# Patient Record
Sex: Female | Born: 1940 | Race: White | Hispanic: No | Smoking: Never smoker
Health system: Southern US, Community
[De-identification: ages and names within clinical notes are randomized; demographics above are authoritative.]

## PROBLEM LIST (undated history)

## (undated) DIAGNOSIS — C50919 Malignant neoplasm of unspecified site of unspecified female breast: Secondary | ICD-10-CM

## (undated) DIAGNOSIS — I10 Essential (primary) hypertension: Secondary | ICD-10-CM

## (undated) DIAGNOSIS — I779 Disorder of arteries and arterioles, unspecified: Secondary | ICD-10-CM

## (undated) DIAGNOSIS — I429 Cardiomyopathy, unspecified: Secondary | ICD-10-CM

## (undated) DIAGNOSIS — I739 Peripheral vascular disease, unspecified: Secondary | ICD-10-CM

## (undated) DIAGNOSIS — Z87891 Personal history of nicotine dependence: Secondary | ICD-10-CM

---

## 2019-09-20 ENCOUNTER — Inpatient Hospital Stay (HOSPITAL_COMMUNITY): Payer: Self-pay

## 2019-09-20 ENCOUNTER — Encounter (HOSPITAL_COMMUNITY): Payer: Self-pay | Admitting: Pulmonary Disease

## 2019-09-20 ENCOUNTER — Inpatient Hospital Stay (HOSPITAL_COMMUNITY)
Admission: AD | Admit: 2019-09-20 | Discharge: 2019-10-07 | DRG: 208 | Disposition: A | Discharge status: Skilled Nursing Facility | Payer: Medicare Other | Source: Other Acute Inpatient Hospital | Attending: Internal Medicine | Admitting: Internal Medicine

## 2019-09-20 ENCOUNTER — Telehealth: Payer: Self-pay | Admitting: Emergency Medicine

## 2019-09-20 ENCOUNTER — Inpatient Hospital Stay (HOSPITAL_COMMUNITY): Payer: Medicare Other

## 2019-09-20 DIAGNOSIS — E785 Hyperlipidemia, unspecified: Secondary | ICD-10-CM | POA: Diagnosis present

## 2019-09-20 DIAGNOSIS — E1122 Type 2 diabetes mellitus with diabetic chronic kidney disease: Secondary | ICD-10-CM | POA: Diagnosis present

## 2019-09-20 DIAGNOSIS — R29810 Facial weakness: Secondary | ICD-10-CM | POA: Diagnosis not present

## 2019-09-20 DIAGNOSIS — G9341 Metabolic encephalopathy: Secondary | ICD-10-CM | POA: Diagnosis not present

## 2019-09-20 DIAGNOSIS — J1289 Other viral pneumonia: Secondary | ICD-10-CM | POA: Diagnosis present

## 2019-09-20 DIAGNOSIS — E875 Hyperkalemia: Secondary | ICD-10-CM | POA: Diagnosis present

## 2019-09-20 DIAGNOSIS — N183 Chronic kidney disease, stage 3 unspecified: Secondary | ICD-10-CM | POA: Diagnosis not present

## 2019-09-20 DIAGNOSIS — I739 Peripheral vascular disease, unspecified: Secondary | ICD-10-CM | POA: Diagnosis present

## 2019-09-20 DIAGNOSIS — Z66 Do not resuscitate: Secondary | ICD-10-CM | POA: Diagnosis present

## 2019-09-20 DIAGNOSIS — Z9221 Personal history of antineoplastic chemotherapy: Secondary | ICD-10-CM

## 2019-09-20 DIAGNOSIS — Z8673 Personal history of transient ischemic attack (TIA), and cerebral infarction without residual deficits: Secondary | ICD-10-CM

## 2019-09-20 DIAGNOSIS — E1151 Type 2 diabetes mellitus with diabetic peripheral angiopathy without gangrene: Secondary | ICD-10-CM | POA: Diagnosis present

## 2019-09-20 DIAGNOSIS — I2510 Atherosclerotic heart disease of native coronary artery without angina pectoris: Secondary | ICD-10-CM | POA: Diagnosis present

## 2019-09-20 DIAGNOSIS — I10 Essential (primary) hypertension: Secondary | ICD-10-CM | POA: Diagnosis present

## 2019-09-20 DIAGNOSIS — Z7901 Long term (current) use of anticoagulants: Secondary | ICD-10-CM | POA: Diagnosis not present

## 2019-09-20 DIAGNOSIS — N1832 Chronic kidney disease, stage 3b: Secondary | ICD-10-CM | POA: Diagnosis present

## 2019-09-20 DIAGNOSIS — Z9010 Acquired absence of unspecified breast and nipple: Secondary | ICD-10-CM | POA: Diagnosis not present

## 2019-09-20 DIAGNOSIS — D649 Anemia, unspecified: Secondary | ICD-10-CM | POA: Diagnosis not present

## 2019-09-20 DIAGNOSIS — I429 Cardiomyopathy, unspecified: Secondary | ICD-10-CM | POA: Diagnosis present

## 2019-09-20 DIAGNOSIS — E876 Hypokalemia: Secondary | ICD-10-CM | POA: Diagnosis not present

## 2019-09-20 DIAGNOSIS — I5022 Chronic systolic (congestive) heart failure: Secondary | ICD-10-CM | POA: Diagnosis present

## 2019-09-20 DIAGNOSIS — R0600 Dyspnea, unspecified: Secondary | ICD-10-CM

## 2019-09-20 DIAGNOSIS — J80 Acute respiratory distress syndrome: Secondary | ICD-10-CM | POA: Diagnosis present

## 2019-09-20 DIAGNOSIS — Z86718 Personal history of other venous thrombosis and embolism: Secondary | ICD-10-CM

## 2019-09-20 DIAGNOSIS — C50919 Malignant neoplasm of unspecified site of unspecified female breast: Secondary | ICD-10-CM | POA: Diagnosis present

## 2019-09-20 DIAGNOSIS — R4701 Aphasia: Secondary | ICD-10-CM | POA: Diagnosis not present

## 2019-09-20 DIAGNOSIS — I6523 Occlusion and stenosis of bilateral carotid arteries: Secondary | ICD-10-CM | POA: Diagnosis present

## 2019-09-20 DIAGNOSIS — I509 Heart failure, unspecified: Secondary | ICD-10-CM

## 2019-09-20 DIAGNOSIS — J9601 Acute respiratory failure with hypoxia: Secondary | ICD-10-CM | POA: Diagnosis not present

## 2019-09-20 DIAGNOSIS — I130 Hypertensive heart and chronic kidney disease with heart failure and stage 1 through stage 4 chronic kidney disease, or unspecified chronic kidney disease: Secondary | ICD-10-CM | POA: Diagnosis present

## 2019-09-20 DIAGNOSIS — R0602 Shortness of breath: Secondary | ICD-10-CM

## 2019-09-20 DIAGNOSIS — I959 Hypotension, unspecified: Secondary | ICD-10-CM | POA: Diagnosis present

## 2019-09-20 DIAGNOSIS — J159 Unspecified bacterial pneumonia: Secondary | ICD-10-CM | POA: Diagnosis present

## 2019-09-20 DIAGNOSIS — Z853 Personal history of malignant neoplasm of breast: Secondary | ICD-10-CM

## 2019-09-20 DIAGNOSIS — J069 Acute upper respiratory infection, unspecified: Secondary | ICD-10-CM | POA: Diagnosis not present

## 2019-09-20 DIAGNOSIS — Z86711 Personal history of pulmonary embolism: Secondary | ICD-10-CM

## 2019-09-20 DIAGNOSIS — U071 COVID-19: Secondary | ICD-10-CM | POA: Diagnosis present

## 2019-09-20 DIAGNOSIS — E1169 Type 2 diabetes mellitus with other specified complication: Secondary | ICD-10-CM | POA: Diagnosis not present

## 2019-09-20 DIAGNOSIS — J1282 Pneumonia due to coronavirus disease 2019: Secondary | ICD-10-CM

## 2019-09-20 DIAGNOSIS — R4781 Slurred speech: Secondary | ICD-10-CM | POA: Diagnosis not present

## 2019-09-20 DIAGNOSIS — J189 Pneumonia, unspecified organism: Secondary | ICD-10-CM | POA: Diagnosis not present

## 2019-09-20 DIAGNOSIS — R0902 Hypoxemia: Secondary | ICD-10-CM | POA: Diagnosis not present

## 2019-09-20 DIAGNOSIS — I159 Secondary hypertension, unspecified: Secondary | ICD-10-CM

## 2019-09-20 DIAGNOSIS — Z87891 Personal history of nicotine dependence: Secondary | ICD-10-CM | POA: Diagnosis not present

## 2019-09-20 DIAGNOSIS — I4891 Unspecified atrial fibrillation: Secondary | ICD-10-CM | POA: Diagnosis not present

## 2019-09-20 DIAGNOSIS — Z7983 Long term (current) use of bisphosphonates: Secondary | ICD-10-CM

## 2019-09-20 DIAGNOSIS — I779 Disorder of arteries and arterioles, unspecified: Secondary | ICD-10-CM | POA: Diagnosis present

## 2019-09-20 HISTORY — DX: Disorder of arteries and arterioles, unspecified: I77.9

## 2019-09-20 HISTORY — DX: Personal history of nicotine dependence: Z87.891

## 2019-09-20 HISTORY — DX: Malignant neoplasm of unspecified site of unspecified female breast: C50.919

## 2019-09-20 HISTORY — DX: Peripheral vascular disease, unspecified: I73.9

## 2019-09-20 HISTORY — DX: Essential (primary) hypertension: I10

## 2019-09-20 HISTORY — DX: Cardiomyopathy, unspecified: I42.9

## 2019-09-20 LAB — CBC WITH DIFFERENTIAL/PLATELET
Abs Immature Granulocytes: 0.05 10*3/uL (ref 0.00–0.07)
Basophils Absolute: 0 10*3/uL (ref 0.0–0.1)
Basophils Relative: 0 %
Eosinophils Absolute: 0 10*3/uL (ref 0.0–0.5)
Eosinophils Relative: 0 %
HCT: 40.4 % (ref 36.0–46.0)
Hemoglobin: 12.9 g/dL (ref 12.0–15.0)
Immature Granulocytes: 1 %
Lymphocytes Relative: 3 %
Lymphs Abs: 0.3 10*3/uL — ABNORMAL LOW (ref 0.7–4.0)
MCH: 30.1 pg (ref 26.0–34.0)
MCHC: 31.9 g/dL (ref 30.0–36.0)
MCV: 94.2 fL (ref 80.0–100.0)
Monocytes Absolute: 0.7 10*3/uL (ref 0.1–1.0)
Monocytes Relative: 8 %
Neutro Abs: 7.5 10*3/uL (ref 1.7–7.7)
Neutrophils Relative %: 88 %
Platelets: 196 10*3/uL (ref 150–400)
RBC: 4.29 MIL/uL (ref 3.87–5.11)
RDW: 13.9 % (ref 11.5–15.5)
WBC: 8.5 10*3/uL (ref 4.0–10.5)
nRBC: 0 % (ref 0.0–0.2)

## 2019-09-20 LAB — C-REACTIVE PROTEIN: CRP: 3.8 mg/dL — ABNORMAL HIGH (ref <1.0)

## 2019-09-20 LAB — COMPREHENSIVE METABOLIC PANEL
ALT: 14 U/L (ref 0–44)
AST: 20 U/L (ref 15–41)
Albumin: 2.7 g/dL — ABNORMAL LOW (ref 3.5–5.0)
Alkaline Phosphatase: 42 U/L (ref 38–126)
Anion gap: 9 (ref 5–15)
BUN: 62 mg/dL — ABNORMAL HIGH (ref 8–23)
CO2: 30 mmol/L (ref 22–32)
Calcium: 8.6 mg/dL — ABNORMAL LOW (ref 8.9–10.3)
Chloride: 98 mmol/L (ref 98–111)
Creatinine, Ser: 1.33 mg/dL — ABNORMAL HIGH (ref 0.44–1.00)
GFR calc Af Amer: 44 mL/min — ABNORMAL LOW (ref >60)
GFR calc non Af Amer: 38 mL/min — ABNORMAL LOW (ref >60)
Glucose, Bld: 87 mg/dL (ref 70–99)
Potassium: 5.4 mmol/L — ABNORMAL HIGH (ref 3.5–5.1)
Sodium: 137 mmol/L (ref 135–145)
Total Bilirubin: 0.8 mg/dL (ref 0.3–1.2)
Total Protein: 5.8 g/dL — ABNORMAL LOW (ref 6.5–8.1)

## 2019-09-20 LAB — POCT I-STAT 7, (LYTES, BLD GAS, ICA,H+H)
Acid-Base Excess: 4 mmol/L — ABNORMAL HIGH (ref 0.0–2.0)
Bicarbonate: 29.4 mmol/L — ABNORMAL HIGH (ref 20.0–28.0)
Calcium, Ion: 1.23 mmol/L (ref 1.15–1.40)
HCT: 39 % (ref 36.0–46.0)
Hemoglobin: 13.3 g/dL (ref 12.0–15.0)
O2 Saturation: 96 %
Patient temperature: 37.8
Potassium: 4.2 mmol/L (ref 3.5–5.1)
Sodium: 133 mmol/L — ABNORMAL LOW (ref 135–145)
TCO2: 31 mmol/L (ref 22–32)
pCO2 arterial: 46.1 mmHg (ref 32.0–48.0)
pH, Arterial: 7.417 (ref 7.350–7.450)
pO2, Arterial: 82 mmHg — ABNORMAL LOW (ref 83.0–108.0)

## 2019-09-20 LAB — URINALYSIS, ROUTINE W REFLEX MICROSCOPIC
Bilirubin Urine: NEGATIVE
Glucose, UA: NEGATIVE mg/dL
Hgb urine dipstick: NEGATIVE
Ketones, ur: NEGATIVE mg/dL
Leukocytes,Ua: NEGATIVE
Nitrite: NEGATIVE
Protein, ur: NEGATIVE mg/dL
Specific Gravity, Urine: 1.017 (ref 1.005–1.030)
pH: 6 (ref 5.0–8.0)

## 2019-09-20 LAB — TYPE AND SCREEN
ABO/RH(D): A POS
Antibody Screen: NEGATIVE

## 2019-09-20 LAB — ABO/RH: ABO/RH(D): A POS

## 2019-09-20 LAB — MAGNESIUM: Magnesium: 2.5 mg/dL — ABNORMAL HIGH (ref 1.7–2.4)

## 2019-09-20 LAB — GLUCOSE, CAPILLARY
Glucose-Capillary: 70 mg/dL (ref 70–99)
Glucose-Capillary: 122 mg/dL — ABNORMAL HIGH (ref 70–99)

## 2019-09-20 LAB — TROPONIN I (HIGH SENSITIVITY)
Troponin I (High Sensitivity): 12 ng/L (ref <18)
Troponin I (High Sensitivity): 16 ng/L (ref <18)

## 2019-09-20 LAB — LACTATE DEHYDROGENASE: LDH: 225 U/L — ABNORMAL HIGH (ref 98–192)

## 2019-09-20 LAB — BRAIN NATRIURETIC PEPTIDE: B Natriuretic Peptide: 29.1 pg/mL (ref 0.0–100.0)

## 2019-09-20 LAB — D-DIMER, QUANTITATIVE: D-Dimer, Quant: 2.42 ug/mL-FEU — ABNORMAL HIGH (ref 0.00–0.50)

## 2019-09-20 LAB — FERRITIN: Ferritin: 524 ng/mL — ABNORMAL HIGH (ref 11–307)

## 2019-09-20 LAB — HEMOGLOBIN A1C
Hgb A1c MFr Bld: 6.1 % — ABNORMAL HIGH (ref 4.8–5.6)
Mean Plasma Glucose: 128.37 mg/dL

## 2019-09-20 LAB — HEPATITIS B SURFACE ANTIGEN: Hepatitis B Surface Ag: NONREACTIVE

## 2019-09-20 LAB — PHOSPHORUS: Phosphorus: 4 mg/dL (ref 2.5–4.6)

## 2019-09-20 MED ORDER — FENTANYL BOLUS VIA INFUSION
25.0000 ug | INTRAVENOUS | Status: DC | PRN
  Administered 2019-09-20: 25 ug via INTRAVENOUS
  Filled 2019-09-20: qty 25

## 2019-09-20 MED ORDER — MIDAZOLAM HCL 2 MG/2ML IJ SOLN
1.0000 mg | INTRAMUSCULAR | Status: DC | PRN
  Administered 2019-09-20 (×2): 1 mg via INTRAVENOUS
  Filled 2019-09-20 (×4): qty 2

## 2019-09-20 MED ORDER — ONDANSETRON HCL 4 MG/2ML IJ SOLN
4.0000 mg | Freq: Four times a day (QID) | INTRAMUSCULAR | Status: DC | PRN
  Administered 2019-09-24 – 2019-10-06 (×3): 4 mg via INTRAVENOUS
  Filled 2019-09-20 (×3): qty 2

## 2019-09-20 MED ORDER — DULOXETINE HCL 60 MG PO CPEP
60.0000 mg | ORAL_CAPSULE | Freq: Every day | ORAL | Status: DC
  Filled 2019-09-20: qty 1

## 2019-09-20 MED ORDER — ACETAMINOPHEN 325 MG PO TABS
650.0000 mg | ORAL_TABLET | ORAL | Status: DC | PRN
  Administered 2019-09-21: 650 mg via ORAL
  Filled 2019-09-20: qty 2

## 2019-09-20 MED ORDER — CHLORHEXIDINE GLUCONATE 0.12% ORAL RINSE (MEDLINE KIT)
15.0000 mL | Freq: Two times a day (BID) | OROMUCOSAL | Status: DC
  Administered 2019-09-20 – 2019-09-22 (×4): 15 mL via OROMUCOSAL

## 2019-09-20 MED ORDER — FAMOTIDINE IN NACL 20-0.9 MG/50ML-% IV SOLN
20.0000 mg | Freq: Two times a day (BID) | INTRAVENOUS | Status: DC
  Administered 2019-09-20 – 2019-09-22 (×5): 20 mg via INTRAVENOUS
  Filled 2019-09-20 (×5): qty 50

## 2019-09-20 MED ORDER — CILOSTAZOL 50 MG PO TABS
50.0000 mg | ORAL_TABLET | Freq: Two times a day (BID) | ORAL | Status: DC
  Administered 2019-09-20 – 2019-09-22 (×5): 50 mg via ORAL
  Filled 2019-09-20 (×7): qty 1

## 2019-09-20 MED ORDER — ZINC SULFATE 220 (50 ZN) MG PO CAPS
220.0000 mg | ORAL_CAPSULE | Freq: Every day | ORAL | Status: DC
  Administered 2019-09-20 – 2019-10-07 (×18): 220 mg
  Filled 2019-09-20 (×19): qty 1

## 2019-09-20 MED ORDER — TRAZODONE HCL 50 MG PO TABS
50.0000 mg | ORAL_TABLET | Freq: Every day | ORAL | Status: DC
  Administered 2019-09-20 – 2019-09-21 (×2): 50 mg via ORAL
  Filled 2019-09-20 (×2): qty 1

## 2019-09-20 MED ORDER — VITAL 1.5 CAL PO LIQD
1000.0000 mL | ORAL | Status: DC
  Administered 2019-09-20: 1000 mL
  Filled 2019-09-20 (×3): qty 1000

## 2019-09-20 MED ORDER — MIDAZOLAM HCL 2 MG/2ML IJ SOLN
1.0000 mg | INTRAMUSCULAR | Status: DC | PRN
  Administered 2019-09-21 (×3): 1 mg via INTRAVENOUS
  Filled 2019-09-20: qty 2

## 2019-09-20 MED ORDER — PRO-STAT SUGAR FREE PO LIQD
30.0000 mL | Freq: Three times a day (TID) | ORAL | Status: DC
  Administered 2019-09-20 – 2019-09-22 (×6): 30 mL
  Filled 2019-09-20 (×6): qty 30

## 2019-09-20 MED ORDER — ORAL CARE MOUTH RINSE
15.0000 mL | OROMUCOSAL | Status: DC
  Administered 2019-09-20 – 2019-09-22 (×21): 15 mL via OROMUCOSAL

## 2019-09-20 MED ORDER — FUROSEMIDE 10 MG/ML IJ SOLN
40.0000 mg | Freq: Once | INTRAMUSCULAR | Status: AC
  Administered 2019-09-20: 40 mg via INTRAVENOUS
  Filled 2019-09-20: qty 4

## 2019-09-20 MED ORDER — DULOXETINE HCL 60 MG PO CPEP
60.0000 mg | ORAL_CAPSULE | Freq: Every day | ORAL | Status: DC
  Administered 2019-09-21: 60 mg via ORAL
  Filled 2019-09-20: qty 1

## 2019-09-20 MED ORDER — CHLORHEXIDINE GLUCONATE CLOTH 2 % EX PADS
6.0000 | MEDICATED_PAD | Freq: Every day | CUTANEOUS | Status: DC
  Administered 2019-09-20 – 2019-10-07 (×18): 6 via TOPICAL

## 2019-09-20 MED ORDER — PHENYLEPHRINE HCL-NACL 10-0.9 MG/250ML-% IV SOLN
0.0000 ug/min | INTRAVENOUS | Status: DC
  Administered 2019-09-20: 20 ug/min via INTRAVENOUS
  Administered 2019-09-21: 30 ug/min via INTRAVENOUS
  Filled 2019-09-20 (×2): qty 250

## 2019-09-20 MED ORDER — ENOXAPARIN SODIUM 80 MG/0.8ML ~~LOC~~ SOLN
75.0000 mg | Freq: Two times a day (BID) | SUBCUTANEOUS | Status: DC
  Administered 2019-09-20 – 2019-09-22 (×4): 75 mg via SUBCUTANEOUS
  Filled 2019-09-20 (×4): qty 0.8

## 2019-09-20 MED ORDER — SIMVASTATIN 20 MG PO TABS
20.0000 mg | ORAL_TABLET | Freq: Every day | ORAL | Status: DC
  Administered 2019-09-20 – 2019-09-21 (×2): 20 mg via ORAL
  Filled 2019-09-20 (×4): qty 1

## 2019-09-20 MED ORDER — PRO-STAT SUGAR FREE PO LIQD: 30.0000 mL | Freq: Two times a day (BID) | ORAL | Status: DC

## 2019-09-20 MED ORDER — FENTANYL CITRATE (PF) 100 MCG/2ML IJ SOLN: 25.0000 ug | Freq: Once | INTRAMUSCULAR | Status: DC

## 2019-09-20 MED ORDER — VITAMIN C 500 MG PO TABS
500.0000 mg | ORAL_TABLET | Freq: Every day | ORAL | Status: DC
  Administered 2019-09-20 – 2019-10-07 (×18): 500 mg
  Filled 2019-09-20 (×17): qty 1

## 2019-09-20 MED ORDER — INSULIN ASPART 100 UNIT/ML ~~LOC~~ SOLN
0.0000 [IU] | SUBCUTANEOUS | Status: DC
  Administered 2019-09-20 – 2019-09-21 (×2): 1 [IU] via SUBCUTANEOUS
  Administered 2019-09-21 – 2019-09-23 (×8): 2 [IU] via SUBCUTANEOUS

## 2019-09-20 MED ORDER — SODIUM ZIRCONIUM CYCLOSILICATE 5 G PO PACK
5.0000 g | PACK | Freq: Once | ORAL | Status: AC
  Administered 2019-09-20: 5 g via ORAL
  Filled 2019-09-20: qty 1

## 2019-09-20 MED ORDER — FENTANYL 2500MCG IN NS 250ML (10MCG/ML) PREMIX INFUSION
25.0000 ug/h | INTRAVENOUS | Status: DC
  Administered 2019-09-20: 25 ug/h via INTRAVENOUS
  Filled 2019-09-20 (×3): qty 250

## 2019-09-20 MED ORDER — DEXMEDETOMIDINE HCL IN NACL 400 MCG/100ML IV SOLN
0.4000 ug/kg/h | INTRAVENOUS | Status: DC
  Administered 2019-09-20: 0.5 ug/kg/h via INTRAVENOUS
  Filled 2019-09-20: qty 100

## 2019-09-20 MED ORDER — VITAL HIGH PROTEIN PO LIQD: 1000.0000 mL | ORAL | Status: DC

## 2019-09-20 NOTE — Progress Notes (Signed)
Initial Nutrition Assessment RD working remotely.  DOCUMENTATION CODES:   Not applicable  INTERVENTION:    Vital 1.5 at 45 ml/h (1080 ml per day)   Pro-stat 30 ml TID   Provides 1920 kcal, 118 gm protein, 825 ml free water daily  NUTRITION DIAGNOSIS:   Increased nutrient needs related to acute illness(COVID-19) as evidenced by estimated needs.  GOAL:   Patient will meet greater than or equal to 90% of their needs  MONITOR:   TF tolerance, Vent status, Labs  REASON FOR ASSESSMENT:   Ventilator, Consult Enteral/tube feeding initiation and management  ASSESSMENT:   78 yo female admitted to [place] [facility] from [facility] on 12/2 with ongoing respiratory distress and for further management by pulmonary critical care. COVID positive on 11/22. Intubated on 11/30. PMH includes breast cancer, former smoker, HTN, CKD-3, DVT.   Received MD Consult for TF initiation and management. OG tube in place.   Patient is currently intubated on ventilator support MV: not yet documented in EMR Temp (24hrs), Avg:99.3 F (37.4 C), Min:98.6 F (37 C), Max:99.9 F (37.7 C)   Labs not yet available for review in EMR.   Medications reviewed and include novolog, vitamin C, zinc sulfate.  No recent weight encounters available for review.  NUTRITION - FOCUSED PHYSICAL EXAM:  deferred  Diet Order:   Diet Order    None      EDUCATION NEEDS:   Not appropriate for education at this time  Skin:  Skin Assessment: Reviewed RN Assessment  Last BM:  no BM documented  Height:   Ht Readings from Last 1 Encounters:  09/20/19 5\' 7"  (1.702 m)    Weight:   Wt Readings from Last 1 Encounters:  09/20/19 74 kg    Ideal Body Weight:  61.4 kg  BMI:  Body mass index is 25.55 kg/m.  Estimated Nutritional Needs:   Kcal:  1850-2100  Protein:  110-130 gm  Fluid:  >/= 1.9 L    Molli Barrows, RD, LDN, [place] Pager [phone] After Hours Pager [phone]

## 2019-09-20 NOTE — Progress Notes (Signed)
ANTICOAGULATION CONSULT NOTE - Initial Consult  Pharmacy Consult for Lovenox Indication: PTA Xarelto for hx DVT  Not on File  Patient Measurements: Weight: 163 lb 2.3 oz (74 kg) Heparin Dosing Weight:   Vital Signs: Temp: 99.5 F (37.5 C) (12/02 1300) Temp Source: Core (12/02 1300) BP: 87/42 (12/02 1300) Pulse Rate: 83 (12/02 1300)  Labs: No results for input(s): HGB, HCT, PLT, APTT, LABPROT, INR, HEPARINUNFRC, HEPRLOWMOCWT, CREATININE, CKTOTAL, CKMB, TROPONINIHS in the last 72 hours.  CrCl cannot be calculated (No successful lab value found.).   Medical History: No past medical history on file.  Medications:  Scheduled:  . chlorhexidine gluconate (MEDLINE KIT)  15 mL Mouth Rinse BID  . cilostazol  50 mg Oral BID  . DULoxetine  60 mg Oral Daily  . feeding supplement (PRO-STAT SUGAR FREE 64)  30 mL Per Tube BID  . feeding supplement (VITAL HIGH PROTEIN)  1,000 mL Per Tube Q24H  . fentaNYL (SUBLIMAZE) injection  25 mcg Intravenous Once  . insulin aspart  0-9 Units Subcutaneous Q4H  . mouth rinse  15 mL Mouth Rinse 10 times per day  . simvastatin  20 mg Oral QHS  . traZODone  50 mg Oral QHS  . vitamin C  500 mg Per Tube Daily  . zinc sulfate  220 mg Per Tube Daily   Infusions:  . famotidine (PEPCID) IV    . fentaNYL infusion INTRAVENOUS      Assessment: 78 yoF [facility].  Hear most recent dose of Xarelto at [facility] was on 12/1 at 17:34.  Pharmacy is consulted to transition to Lovenox. CBC stable, SCr pending, D-dimer 2.42.  Goal of Therapy:  Anti-Xa level 0.6-1 units/ml 4hrs after LMWH dose given Monitor platelets by anticoagulation protocol: Yes   Plan:  Lovenox 1 mg/kg (75 mg) [place] q12h. Pharmacy to f/u renal function, CBC, s/s bleeding.  Gretta Arab PharmD, BCPS Clinical pharmacist phone 7am- 5pm: [phone] 09/20/2019 1:31 PM

## 2019-09-20 NOTE — Progress Notes (Signed)
[facility] PCCM evening rounds  Very awake on fentanyl infusion ABG OK, severe hypoxemia Hyperkalemia, Cr improved compared to yesterday's labs at OSH CXR with interstitial infiltrate, minimal air space disease Echo results pending  Give lasix x1 dose lokelma Repeat labs in AM Add precedex  Roselie Awkward, MD [place] [facility] Pager: [phone] Cell: [phone] If no response, call [phone]

## 2019-09-20 NOTE — Progress Notes (Signed)
Wallet, Keys, and Phone placed in [place] envelope, labeled with patient label, and brought to security by charge RN.

## 2019-09-20 NOTE — Progress Notes (Signed)
[facility] Progress Note Patient Name: Christie Harper DOB: November 08, 1940 MRN: [id number]   Date of Service  09/20/2019  HPI/Events of Note  Hypotension - BP = 62/50 with MAP = 56. Bedside nurse has backed off on sedation IV infusions. Last pH = 7.417 at 4:19 PM.   eICU Interventions  Will order: 1. Phenylephrine IV infusion. Titrate to MAP >= 65.  2. Monitor CVP now and Q 4 hous.      Intervention Category Major Interventions: Hypotension - evaluation and management  Christie Harper 09/20/2019, 8:54 PM

## 2019-09-20 NOTE — Progress Notes (Signed)
Pt arrived from [place] with a 7.5 ETT at 23cm at the lip. PRVC 450/20/80/+12. RT will continue to monitor.

## 2019-09-20 NOTE — H&P (Addendum)
NAME:  Christie Harper, MRN:  [id number], DOB:  [birth year], LOS: 0 ADMISSION DATE:  09/20/2019, CONSULTATION DATE:  12/2 REFERRING MD:  [facility] MD, CHIEF COMPLAINT:  dyspnea   Brief History   78 year old female initially admitted to [facility] on November 21 in the setting of mild hypoxemia and diarrhea.  Covid test became positive on November 22.  Treated with Decadron for 10 days, remdesivir 5 days.  Hypoxemia progressed.  Intubated on November 30.  Transferred to [facility] for further management by pulmonary critical care on December 2.  History of present illness   78 year old female initially admitted to [facility] on November 21 in the setting of mild hypoxemia and diarrhea.  Covid test became positive on November 22.  Treated with Decadron for 10 days, remdesivir 5 days.  Hypoxemia progressed.  Intubated on November 30.  Transferred to [facility] for further management by pulmonary critical care on December 2. The patient was intubated and sedated upon my arrival so she was unable to provide history.  History is obtained by talking to the provider from the other hospital as well as reviewing records including a discharge summary.  As detailed above she was hospitalized for about 10 days before she got intubated.  She was treated for Covid.  She was noted to have a fever on the day that she was intubated (November 30) with worsening hypoxemia.  She also had worsening acute kidney injury.  A PICC line was placed around the time of intubation.  At baseline she is reportedly active, lives independently.  Has no children.  Has a sister.  Per the hospitalist from the outside hospital she desired to be intubated and have full CODE STATUS.  Past Medical History  Hypertension CKD III Breast cancer s/p mastectomy and chemotherapy Former smoker, quit 2008 Bilateral femoral and iliac stents Carotid stenosis L complete, 50% R Unprovoked DVT  on [facility]   11/21 admit [place] hospital diarrhea, mild hypoxemia 11/22 COVID test positive, started remdesivir and decadron 11/24 4-8 L O2 11/30 started heated high flow O2, new fever, intubated 12/2 transfer to [facility]  Consults:    Procedures:  11/30 ETT >  11/30 R arm PICC >   Significant Diagnostic Tests:    Micro Data:  11/19 SARS COV 2 > positive (performed at her PCP office) 12/2 resp culture>   Antimicrobials:  Decadron 11/22> 12/1 Remdesivir 11/22> 11/26 Ceftriaxone 11/24 > 11/28 Azithro 11/24 > 11/28   Interim history/subjective:  As above  Objective   Blood pressure (!) 87/42, pulse 83, temperature 99.5 F (37.5 C), temperature source Core, resp. rate (!) 21, weight 74 kg, SpO2 100 %.       No intake or output data in the 24 hours ending 09/20/19 1324 Filed Weights   09/20/19 1230  Weight: 74 kg    Examination:  General:  In bed on vent HENT: NCAT ETT in place PULM: CTA B, vent supported breathing CV: RRR, no mgr GI: BS+, soft, nontender MSK: normal bulk and tone Neuro: awake, follows commands, moves all four ext  12/2 CXR images personally reviewed: bilateral R>L interstitial infiltrate, ETT in place  Resolved Hospital Problem list     Assessment & Plan:   ARDS COVID pneumonia: treated with steroids and remdesivir at OSH; unclear severity at this point Admit to ICU Check ABG Continue mechanical ventilation per ARDS protocol Target TVol 6-8cc/kgIBW Target Plateau Pressure < 30cm H20 Target driving  pressure less than 15 cm of water Target PaO2 55-65: titrate PEEP/FiO2 per protocol As long as PaO2 to FiO2 ratio is less than 1:150 position in prone position for 16 hours a day Check CVP daily if CVL in place Target CVP less than 4, diurese as necessary Ventilator associated pneumonia prevention protocol  HCAP? Send resp culture Send CBC Chest X-ray now  CKD, stage III Monitor BMET and UOP Replace  electrolytes as needed  Baseline hypertension Hypotension now on propofol Stop propofol Hold amlodipine  Need for sedation for mechanical ventilation Stop propofol Start PAD protocol: fentanyl infusion, prn versed RASS target -1 to -2  History of unprovoked DVT/PE lovenox  History of cardiomyopathy Check BNP Check Echocardiogram  Peripheral arterial disease/carotid artery disease pletal per home regimen  General Check admit labs, COVID biomarkers  Best practice:  Diet: start tube feeding Pain/Anxiety/Delirium protocol (if indicated): as above VAP protocol (if indicated): yes DVT prophylaxis: lovenox GI prophylaxis: famotidine Glucose control: SSI, q4h Mobility: bed rest Code Status: full Family Communication: Will cal the patient's family today Disposition: remain in ICU  Labs   CBC: No results for input(s): WBC, NEUTROABS, HGB, HCT, MCV, PLT in the last 168 hours.  Basic Metabolic Panel: No results for input(s): NA, K, CL, CO2, GLUCOSE, BUN, CREATININE, CALCIUM, MG, PHOS in the last 168 hours. GFR: CrCl cannot be calculated (No successful lab value found.). No results for input(s): PROCALCITON, WBC, LATICACIDVEN in the last 168 hours.  Liver Function Tests: No results for input(s): AST, ALT, ALKPHOS, BILITOT, PROT, ALBUMIN in the last 168 hours. No results for input(s): LIPASE, AMYLASE in the last 168 hours. No results for input(s): AMMONIA in the last 168 hours.  ABG No results found for: PHART, PCO2ART, PO2ART, HCO3, TCO2, ACIDBASEDEF, O2SAT   Coagulation Profile: No results for input(s): INR, PROTIME in the last 168 hours.  Cardiac Enzymes: No results for input(s): CKTOTAL, CKMB, CKMBINDEX, TROPONINI in the last 168 hours.  HbA1C: No results found for: HGBA1C  CBG: No results for input(s): GLUCAP in the last 168 hours.  Review of Systems:   Cannot obtain due to intubation  Past Medical History  She,  has a past medical history of Breast  cancer ([place]), Cardiomyopathy ([place]), Carotid artery disease ([place]), Former smoker, Hypertension, and Peripheral arterial disease ([place]).   Surgical History / social history / family history cannot obtain    Allergies Not on File   Home Medications  Prior to Admission medications   Not on File     Critical care time: 60 minutes      Roselie Awkward, MD [place] [facility] Pager: [phone] Cell: [phone] If no response, call [phone]

## 2019-09-20 NOTE — Progress Notes (Signed)
  Echocardiogram 2D Echocardiogram has been performed.  Christie Harper M 09/20/2019, 2:39 PM

## 2019-09-21 ENCOUNTER — Other Ambulatory Visit: Payer: Self-pay

## 2019-09-21 DIAGNOSIS — I739 Peripheral vascular disease, unspecified: Secondary | ICD-10-CM

## 2019-09-21 DIAGNOSIS — I429 Cardiomyopathy, unspecified: Secondary | ICD-10-CM | POA: Diagnosis not present

## 2019-09-21 DIAGNOSIS — I5022 Chronic systolic (congestive) heart failure: Secondary | ICD-10-CM

## 2019-09-21 DIAGNOSIS — U071 COVID-19: Secondary | ICD-10-CM | POA: Diagnosis not present

## 2019-09-21 DIAGNOSIS — I6523 Occlusion and stenosis of bilateral carotid arteries: Secondary | ICD-10-CM | POA: Diagnosis not present

## 2019-09-21 DIAGNOSIS — J189 Pneumonia, unspecified organism: Secondary | ICD-10-CM

## 2019-09-21 LAB — COMPREHENSIVE METABOLIC PANEL
ALT: 15 U/L (ref 0–44)
AST: 16 U/L (ref 15–41)
Albumin: 2.6 g/dL — ABNORMAL LOW (ref 3.5–5.0)
Alkaline Phosphatase: 48 U/L (ref 38–126)
Anion gap: 10 (ref 5–15)
BUN: 65 mg/dL — ABNORMAL HIGH (ref 8–23)
CO2: 29 mmol/L (ref 22–32)
Calcium: 8.6 mg/dL — ABNORMAL LOW (ref 8.9–10.3)
Chloride: 99 mmol/L (ref 98–111)
Creatinine, Ser: 1.14 mg/dL — ABNORMAL HIGH (ref 0.44–1.00)
GFR calc Af Amer: 53 mL/min — ABNORMAL LOW (ref >60)
GFR calc non Af Amer: 45 mL/min — ABNORMAL LOW (ref >60)
Glucose, Bld: 124 mg/dL — ABNORMAL HIGH (ref 70–99)
Potassium: 3.6 mmol/L (ref 3.5–5.1)
Sodium: 138 mmol/L (ref 135–145)
Total Bilirubin: 0.7 mg/dL (ref 0.3–1.2)
Total Protein: 6 g/dL — ABNORMAL LOW (ref 6.5–8.1)

## 2019-09-21 LAB — GLUCOSE, CAPILLARY
Glucose-Capillary: 109 mg/dL — ABNORMAL HIGH (ref 70–99)
Glucose-Capillary: 125 mg/dL — ABNORMAL HIGH (ref 70–99)
Glucose-Capillary: 128 mg/dL — ABNORMAL HIGH (ref 70–99)
Glucose-Capillary: 151 mg/dL — ABNORMAL HIGH (ref 70–99)
Glucose-Capillary: 157 mg/dL — ABNORMAL HIGH (ref 70–99)
Glucose-Capillary: 185 mg/dL — ABNORMAL HIGH (ref 70–99)
Glucose-Capillary: 91 mg/dL (ref 70–99)

## 2019-09-21 LAB — CBC WITH DIFFERENTIAL/PLATELET
Abs Immature Granulocytes: 0.05 10*3/uL (ref 0.00–0.07)
Basophils Absolute: 0 10*3/uL (ref 0.0–0.1)
Basophils Relative: 0 %
Eosinophils Absolute: 0 10*3/uL (ref 0.0–0.5)
Eosinophils Relative: 0 %
HCT: 41.5 % (ref 36.0–46.0)
Hemoglobin: 13.1 g/dL (ref 12.0–15.0)
Immature Granulocytes: 1 %
Lymphocytes Relative: 3 %
Lymphs Abs: 0.3 10*3/uL — ABNORMAL LOW (ref 0.7–4.0)
MCH: 30.3 pg (ref 26.0–34.0)
MCHC: 31.6 g/dL (ref 30.0–36.0)
MCV: 95.8 fL (ref 80.0–100.0)
Monocytes Absolute: 0.6 10*3/uL (ref 0.1–1.0)
Monocytes Relative: 7 %
Neutro Abs: 7.4 10*3/uL (ref 1.7–7.7)
Neutrophils Relative %: 89 %
Platelets: 197 10*3/uL (ref 150–400)
RBC: 4.33 MIL/uL (ref 3.87–5.11)
RDW: 14.1 % (ref 11.5–15.5)
WBC: 8.3 10*3/uL (ref 4.0–10.5)
nRBC: 0 % (ref 0.0–0.2)

## 2019-09-21 LAB — ECHOCARDIOGRAM COMPLETE: Weight: 2610.25 oz

## 2019-09-21 LAB — PHOSPHORUS
Phosphorus: 4.1 mg/dL (ref 2.5–4.6)
Phosphorus: 3.5 mg/dL (ref 2.5–4.6)

## 2019-09-21 LAB — C-REACTIVE PROTEIN: CRP: 6.4 mg/dL — ABNORMAL HIGH (ref <1.0)

## 2019-09-21 LAB — MAGNESIUM
Magnesium: 2.1 mg/dL (ref 1.7–2.4)
Magnesium: 1.9 mg/dL (ref 1.7–2.4)

## 2019-09-21 LAB — PROCALCITONIN: Procalcitonin: <0.1 ng/mL

## 2019-09-21 LAB — FERRITIN: Ferritin: 535 ng/mL — ABNORMAL HIGH (ref 11–307)

## 2019-09-21 LAB — D-DIMER, QUANTITATIVE: D-Dimer, Quant: 2.43 ug/mL-FEU — ABNORMAL HIGH (ref 0.00–0.50)

## 2019-09-21 MED ORDER — ASTELIN 137 MCG/SPRAY NA SOLN
40.00 | NASAL | Status: DC
Start: 2019-09-20 — End: 2019-09-21

## 2019-09-21 MED ORDER — DIHYDROTACHYSTEROL 0.125 MG PO TABS
10.00 | ORAL_TABLET | ORAL | Status: DC
Start: 2019-09-20 — End: 2019-09-21

## 2019-09-21 MED ORDER — DURACLON EP
1000.00 | EPIDURAL | Status: DC
Start: 2019-09-21 — End: 2019-09-21

## 2019-09-21 MED ORDER — MEDI-NATURAL PLUS 8.6-50 MG PO TABS
50.00 | ORAL_TABLET | ORAL | Status: DC
Start: 2019-09-20 — End: 2019-09-21

## 2019-09-21 MED ORDER — SODIUM CHLORIDE 0.9 % IV SOLN
2.0000 g | Freq: Two times a day (BID) | INTRAVENOUS | Status: AC
  Administered 2019-09-21 – 2019-09-25 (×10): 2 g via INTRAVENOUS
  Filled 2019-09-21 (×10): qty 2

## 2019-09-21 MED ORDER — SODIUM CHLORIDE 3 % IN NEBU
4.0000 mL | INHALATION_SOLUTION | Freq: Two times a day (BID) | RESPIRATORY_TRACT | Status: DC
  Administered 2019-09-21 – 2019-09-22 (×3): 4 mL via RESPIRATORY_TRACT
  Filled 2019-09-21 (×7): qty 4

## 2019-09-21 MED ORDER — FUROSEMIDE 10 MG/ML IJ SOLN
40.0000 mg | Freq: Four times a day (QID) | INTRAMUSCULAR | Status: AC
  Administered 2019-09-21 (×2): 40 mg via INTRAVENOUS
  Filled 2019-09-21 (×2): qty 4

## 2019-09-21 MED ORDER — STRI-DEX MAXIMUM STRENGTH 2 % EX PADS
125.00 | MEDICATED_PAD | CUTANEOUS | Status: DC
Start: ? — End: 2019-09-21

## 2019-09-21 MED ORDER — DIOTAME PO
25.00 | ORAL | Status: DC
Start: 2019-09-20 — End: 2019-09-21

## 2019-09-21 MED ORDER — Medication
500.00 | Status: DC
Start: 2019-09-20 — End: 2019-09-21

## 2019-09-21 MED ORDER — METOPROLOL TARTRATE 5 MG/5ML IV SOLN
2.5000 mg | Freq: Four times a day (QID) | INTRAVENOUS | Status: DC
  Administered 2019-09-21: 2.5 mg via INTRAVENOUS
  Filled 2019-09-21: qty 5

## 2019-09-21 MED ORDER — Medication
20.00 | Status: DC
Start: 2019-09-20 — End: 2019-09-21

## 2019-09-21 MED ORDER — TRIAMINIC COUGH/SORE THROAT PO
15.00 | ORAL | Status: DC
Start: 2019-09-20 — End: 2019-09-21

## 2019-09-21 MED ORDER — MISC NATURAL PRODUCT NASAL NA PACK
60.00 | PACK | NASAL | Status: DC
Start: 2019-09-21 — End: 2019-09-21

## 2019-09-21 MED ORDER — CYANOCOBALAMIN SL
20.00 | SUBLINGUAL | Status: DC
Start: 2019-09-20 — End: 2019-09-21

## 2019-09-21 MED ORDER — FOSPHENYTOIN SODIUM 50 MG PE/ML IJ SOLN
15.00 | INTRAMUSCULAR | Status: DC
Start: ? — End: 2019-09-21

## 2019-09-21 MED ORDER — COMPOUND W FREEZE OFF EX AERO
1.00 | INHALATION_SPRAY | CUTANEOUS | Status: DC
Start: 2019-09-20 — End: 2019-09-21

## 2019-09-21 MED ORDER — Medication
20.00 | Status: DC
Start: ? — End: 2019-09-21

## 2019-09-21 MED ORDER — Medication
Status: DC
Start: 2019-09-20 — End: 2019-09-21

## 2019-09-21 MED ORDER — Medication
Status: DC
Start: ? — End: 2019-09-21

## 2019-09-21 MED ORDER — CALCIUM-VITAMIN D-VITAMIN K 600-1000-90 MG-UNT-MCG PO TABS
0.00 | ORAL_TABLET | ORAL | Status: DC
Start: ? — End: 2019-09-21

## 2019-09-21 MED ORDER — METRISET BURETTE SET MISC
Status: DC
Start: ? — End: 2019-09-21

## 2019-09-21 MED ORDER — METOPROLOL TARTRATE 5 MG/5ML IV SOLN
5.0000 mg | Freq: Four times a day (QID) | INTRAVENOUS | Status: DC
  Administered 2019-09-21 – 2019-09-22 (×3): 5 mg via INTRAVENOUS
  Filled 2019-09-21 (×3): qty 5

## 2019-09-21 MED ORDER — Medication
625.00 | Status: DC
Start: ? — End: 2019-09-21

## 2019-09-21 MED ORDER — EQL SHEER SPOTS SMALL MISC
0.00 | Status: DC
Start: ? — End: 2019-09-21

## 2019-09-21 MED ORDER — EQ LUBRICANT EYE DROPS OP
650.00 | OPHTHALMIC | Status: DC
Start: ? — End: 2019-09-21

## 2019-09-21 MED ORDER — TRIAMINIC COUGH/SORE THROAT PO
15.00 | ORAL | Status: DC
Start: ? — End: 2019-09-21

## 2019-09-21 NOTE — Progress Notes (Signed)
Pharmacy Antibiotic Note  Christie Harper is a 78 y.o. female admitted on 09/20/2019 with Covid-19. Planning to start cefepime for possible bacterial pneumonia on top of Covid. Pt with increased secretions, continues to be febrile, no procalcitonin at this time.   Plan: -Cefepime 2 g IV q12h -Monitor renal fx, cultures, length of therapy  Height: 5\' 7"  (170.2 cm) Weight: 163 lb 2.3 oz (74 kg) IBW/kg (Calculated) : 61.6  Temp (24hrs), Avg:100.5 F (38.1 C), Min:98.2 F (36.8 C), Max:101.3 F (38.5 C)  Recent Labs  Lab 09/20/19 1415 09/21/19 0400  WBC 8.5 8.3  CREATININE 1.33* 1.14*     Christie Harper 09/21/2019 10:23 AM

## 2019-09-21 NOTE — Progress Notes (Signed)
Items sent with security for lock up: 1 wallet 1 photo ID 1 Medicare card 1 [place] card 2 visa cards 1 hundred dollar bill 2 ten dollar bills 6 twenty dollar bills 3 one dollar bills 1 set of car keys 1 cell phone 1 gold bracelet 1 life alert watch bracelet

## 2019-09-21 NOTE — Progress Notes (Signed)
PROGRESS NOTE                                                                                                                                                                                                             Patient Demographics:    Christie Harper, is a 78 y.o. female, DOB - [birth year], HFW:[id number]  Admit date - 09/20/2019   Admitting Physician Juanito Doom, MD  Outpatient Primary MD for the patient is No primary care provider on file.  LOS - 1   No chief complaint on file.      Brief Narrative    78 year old femalewith a history of HTN, severe PAD (Pletal) s/p bilateral iliac and femoral stenting, carotid stenosis (complete L occlusion, 50% R), unprovoked DVT/PE (Xarelto indefinitely), CKD III, h/o BRCA s/pmastectomy and chemo, former tobacco use (quit 2008).  initially admitted to [facility] on November 21 in the setting of mild hypoxemia and diarrhea.  Covid test became positive on November 22.  Treated with Decadron for 10 days, remdesivir 5 days.  Hypoxemia progressed.  Intubated on November 30.  Transferred to [facility] for further management by pulmonary critical care on December   11/21 admit [place] hospital diarrhea, mild hypoxemia 11/22 COVID test positive, started remdesivir and decadron 11/24 4-8 L O2 11/30 started heated high flow O2, new fever, intubated 12/2 transfer to [facility]   Subjective:    Christie Harper today remains sedated, intubated, she was hypotensive, bradycardic overnight on Precedex which has been stopped   Assessment  & Plan :    Active Problems:   COVID-19   Peripheral arterial disease ([place])   Hypertension   Former smoker   Carotid artery disease ([place])   Cardiomyopathy ([place])   Breast cancer ([place])  Acute hypoxic respiratory failure/ARDS due to COVID-19 pneumonia -Patient required intubation at OSH, transferred to [facility] for [facility] management. -Vent  management per PCCM, on ARDS protocol, sedation per PCCM as well. -She was treated with remdesivir. -Continue with steroids. -she is late  her disease to consider Actemra or plasma. -Patient remains febrile, follow-up blood cultures, on cefepime empirically. -Procalcitonin within normal limit. -continue With diuresis.   COVID-19 Labs  Recent Labs    09/20/19 1415 09/21/19 0400  DDIMER 2.42* 2.43*  FERRITIN 524* 535*  LDH 225*  --  CRP 3.8* 6.4*   Chronic systolic CHF -EF 14% with apical akinesis, she is already on full dose Lovenox for known history of PE/DVT. -Per outpatient cardiologist note, most recent cardiac cath 2012, significant for nonobstructive 60% mid LAD. -On IV Lasix, monitor volume status closely.  Hyperlipidemia -Continue with statin  History of PE/DVT -Unprovoked, continue with anticoagulation, on Xarelto as outpatient, currently on Lovenox during hospital stay.  History of CAD -Please see above discussion, 2D echo with apical akinesis,  continue with statin, and beta-blockers.  History of severe PVD/carotid artery disease -Status post multiple stenting, continue with Pletal.  Goals of care:  -Cussed with her sister, patient has no children, she is her next of kin, and discussed goals of care, patient sister would like to continue with full medical management currently, including vent support, and pressors as needed, but agreeable to patient DNR      Code Status : DNR  Family Communication  : Sister via phone  Disposition Plan  : Remains in ICU  Consults  :  PCCM  Procedures  :  None  DVT Prophylaxis  :  Lovenox  Lab Results  Component Value Date   PLT 197 09/21/2019    Antibiotics  :    Anti-infectives (From admission, onward)   Start     Dose/Rate Route Frequency Ordered Stop   09/21/19 1100  ceFEPIme (MAXIPIME) 2 g in sodium chloride 0.9 % 100 mL IVPB     2 g 200 mL/hr over 30 Minutes Intravenous Every 12 hours 09/21/19 1022           Objective:   Vitals:   09/21/19 1100 09/21/19 1140 09/21/19 1200 09/21/19 1300  BP: (!) 147/76 132/76 127/69 122/71  Pulse: (!) 124 (!) 118 (!) 118 (!) 120  Resp: '18 20 20 20  ' Temp: 99.9 F (37.7 C) (!) 100.6 F (38.1 C) (!) 100.6 F (38.1 C) (!) 100.8 F (38.2 C)  TempSrc:   Core   SpO2: 93% 92% 93% 94%  Weight:      Height:        Wt Readings from Last 3 Encounters:  09/20/19 74 kg     Intake/Output Summary (Last 24 hours) at 09/21/2019 1458 Last data filed at 09/21/2019 1300 Gross per 24 hour  Intake 1838.48 ml  Output 2890 ml  Net -1051.52 ml     Physical Exam  Patient is sedated, intubated, currently on sedation vacation, she is able to open her eyes, will does not follow commands, appears to be moving all extremities. Symmetrical Chest wall movement, Good air movement bilaterally,  RRR,No Gallops,Rubs or new Murmurs, No Parasternal Heave +ve B.Sounds, Abd Soft,No rebound - guarding or rigidity. No Cyanosis, Clubbing or edema, No new Rash or bruise     Data Review:    CBC Recent Labs  Lab 09/20/19 1415 09/20/19 1619 09/21/19 0400  WBC 8.5  --  8.3  HGB 12.9 13.3 13.1  HCT 40.4 39.0 41.5  PLT 196  --  197  MCV 94.2  --  95.8  MCH 30.1  --  30.3  MCHC 31.9  --  31.6  RDW 13.9  --  14.1  LYMPHSABS 0.3*  --  0.3*  MONOABS 0.7  --  0.6  EOSABS 0.0  --  0.0  BASOSABS 0.0  --  0.0    Chemistries  Recent Labs  Lab 09/20/19 1415 09/20/19 1619 09/21/19 0400  NA 137 133* 138  K 5.4* 4.2 3.6  CL  98  --  99  CO2 30  --  29  GLUCOSE 87  --  124*  BUN 62*  --  65*  CREATININE 1.33*  --  1.14*  CALCIUM 8.6*  --  8.6*  MG 2.5*  --  2.1  AST 20  --  16  ALT 14  --  15  ALKPHOS 42  --  48  BILITOT 0.8  --  0.7   ------------------------------------------------------------------------------------------------------------------ No results for input(s): CHOL, HDL, LDLCALC, TRIG, CHOLHDL, LDLDIRECT in the last 72 hours.  Lab Results    Component Value Date   HGBA1C 6.1 (H) 09/20/2019   ------------------------------------------------------------------------------------------------------------------ No results for input(s): TSH, T4TOTAL, T3FREE, THYROIDAB in the last 72 hours.  Invalid input(s): FREET3 ------------------------------------------------------------------------------------------------------------------ Recent Labs    09/20/19 1415 09/21/19 0400  FERRITIN 524* 535*    Coagulation profile No results for input(s): INR, PROTIME in the last 168 hours.  Recent Labs    09/20/19 1415 09/21/19 0400  DDIMER 2.42* 2.43*    Cardiac Enzymes No results for input(s): CKMB, TROPONINI, MYOGLOBIN in the last 168 hours.  Invalid input(s): CK ------------------------------------------------------------------------------------------------------------------    Component Value Date/Time   BNP 29.1 09/20/2019 1415    Inpatient Medications  Scheduled Meds:  chlorhexidine gluconate (MEDLINE KIT)  15 mL Mouth Rinse BID   Chlorhexidine Gluconate Cloth  6 each Topical Daily   cilostazol  50 mg Oral BID   enoxaparin (LOVENOX) injection  75 mg Subcutaneous Q12H   feeding supplement (PRO-STAT SUGAR FREE 64)  30 mL Per Tube TID   fentaNYL (SUBLIMAZE) injection  25 mcg Intravenous Once   furosemide  40 mg Intravenous Q6H   insulin aspart  0-9 Units Subcutaneous Q4H   mouth rinse  15 mL Mouth Rinse 10 times per day   metoprolol tartrate  2.5 mg Intravenous Q6H   simvastatin  20 mg Oral QHS   sodium chloride HYPERTONIC  4 mL Nebulization Q12H   traZODone  50 mg Oral QHS   vitamin C  500 mg Per Tube Daily   zinc sulfate  220 mg Per Tube Daily   Continuous Infusions:  ceFEPime (MAXIPIME) IV Stopped (09/21/19 1204)   dexmedetomidine (PRECEDEX) IV infusion Stopped (09/20/19 2332)   famotidine (PEPCID) IV Stopped (09/21/19 1000)   feeding supplement (VITAL 1.5 CAL) 1,000 mL (09/20/19 1739)    fentaNYL infusion INTRAVENOUS 125 mcg/hr (09/21/19 1300)   phenylephrine (NEO-SYNEPHRINE) Adult infusion Stopped (09/21/19 0348)   PRN Meds:.acetaminophen, fentaNYL, midazolam, midazolam, ondansetron (ZOFRAN) IV  Micro Results Recent Results (from the past 240 hour(s))  Culture, blood (routine x 2)     Status: None (Preliminary result)   Collection Time: 09/21/19  8:50 AM   Specimen: BLOOD  Result Value Ref Range Status   Specimen Description   Final    BLOOD RIGHT HAND Performed at [facility], [place] [address] [place], [place] [address]    Special Requests   Final    BOTTLES DRAWN AEROBIC ONLY Blood Culture results may not be optimal due to an inadequate volume of blood received in culture bottles Performed at [place] [address], [place], [place] [address]    Culture PENDING  Incomplete   Report Status PENDING  Incomplete    Radiology Reports Portable Chest 1 View  Result Date: 09/20/2019 CLINICAL DATA:  Shortness of breath EXAM: PORTABLE CHEST 1 VIEW COMPARISON:  None. FINDINGS: Patchy areas of interstitial airspace disease throughout the right lung and to lesser extent  left mid lung. No pleural effusion or pneumothorax. Stable cardiomediastinal silhouette. No aggressive osseous lesion. Endotracheal tube with the tip 6 cm above the carina. Right-sided PICC line with the tip projecting over the SVC. Nasogastric tube coursing below the diaphragm. No acute osseous abnormality. IMPRESSION: 1. Patchy areas of interstitial airspace disease throughout the right lung and to lesser extent left mid lung concerning for atypical viral pneumonia. Electronically Signed   By: Kathreen Devoid   On: 09/20/2019 13:45      Kahle Mcqueen M.D on 09/21/2019 at 2:58 PM  Between 7am to 7pm - Pager - [phone]  After 7pm go to [email or web address] - password [facility]  [facility] -  Office  [phone]

## 2019-09-21 NOTE — Progress Notes (Signed)
NAME:  Christie Harper, MRN:  [id number], DOB:  1941/07/18, LOS: 1 ADMISSION DATE:  09/20/2019, CONSULTATION DATE:  12/2 REFERRING MD:  [facility] MD, CHIEF COMPLAINT:  dyspnea   Brief History   78 year old female initially admitted to [facility] on November 21 in the setting of mild hypoxemia and diarrhea.  Covid test became positive on November 22.  Treated with Decadron for 10 days, remdesivir 5 days.  Hypoxemia progressed.  Intubated on November 30.  Transferred to [facility] for further management by pulmonary critical care on December 2.  Past Medical History  Hypertension CKD III Breast cancer s/p mastectomy and chemotherapy Former smoker, quit 2008 Bilateral femoral and iliac stents Carotid stenosis L complete, 50% R Unprovoked DVT on [facility]   11/21 admit [place] hospital diarrhea, mild hypoxemia 11/22 COVID test positive, started remdesivir and decadron 11/24 4-8 L O2 11/30 started heated high flow O2, new fever, intubated 12/2 transfer to [facility]  Consults:    Procedures:  11/30 ETT >  11/30 R arm PICC >   Significant Diagnostic Tests:  12/3 Echo> LVEF 35-40%, mild LVH, inf/sept akinesis, RV not well visualized  Micro Data:  11/19 SARS COV 2 > positive (performed at her PCP office) 12/2 resp culture>   Antimicrobials:  Decadron 11/22> 12/1 Remdesivir 11/22> 11/26 Ceftriaxone 11/24 > 11/28 Azithro 11/24 > 11/28   Interim history/subjective:   Bradycardia, hypotension on precedex, that was stopped overnight  Objective   Blood pressure (!) 115/50, pulse 60, temperature (!) 101.3 F (38.5 C), resp. rate 20, height 5\' 7"  (1.702 m), weight 74 kg, SpO2 91 %. CVP:  [23 mmHg] 23 mmHg  Vent Mode: PRVC FiO2 (%):  [50 %-80 %] 50 % Set Rate:  [20 bmp] 20 bmp Vt Set:  [450 mL] 450 mL PEEP:  [10 [id number] cmH20] 10 cmH20 Plateau Pressure:  [20 cmH20-27 cmH20] 20 cmH20   Intake/Output Summary (Last 24  hours) at 09/21/2019 0755 Last data filed at 09/21/2019 0600 Gross per 24 hour  Intake 788.86 ml  Output 1485 ml  Net -696.14 ml   Filed Weights   09/20/19 1230  Weight: 74 kg    Examination:  General:  In bed on vent HENT: NCAT ETT in place PULM: CTA B, vent supported breathing CV: RRR, no mgr GI: BS+, soft, nontender MSK: normal bulk and tone Neuro: sedated on vent   12/2 CXR images personally reviewed: bilateral R>L interstitial infiltrate, ETT in place  Resolved Hospital Problem list     Assessment & Plan:   ARDS COVID pneumonia: treated with steroids and remdesivir at OSH; unclear severity at this point Suspect HCAP Continue mechanical ventilation per ARDS protocol Target TVol 6-8cc/kgIBW Target Plateau Pressure < 30cm H20 Target driving pressure less than 15 cm of water Target PaO2 55-65: titrate PEEP/FiO2 per protocol As long as PaO2 to FiO2 ratio is less than 1:150 position in prone position for 16 hours a day Check CVP daily if CVL in place Target CVP less than 4, diurese as necessary Ventilator associated pneumonia prevention protocol 12/3 start cefepime, send resp culture, f/u culture, start hypertonic saline, diurese again  Systolic heart failure, chronic Lasix Metoprolol Tele  CKD, stage III Monitor BMET and UOP Replace electrolytes as needed  Baseline hypertension Restart metoprolol IV low dose and titrate up as able  Need for sedation for mechanical ventilation PAD protocol -1 to -2 Fentanyl infusion, prn versed  History of unprovoked DVT/PE lovenox  Peripheral  arterial disease/carotid artery disease pletal per home regimen   Best practice:  Diet: start tube feeding Pain/Anxiety/Delirium protocol (if indicated): as above VAP protocol (if indicated): yes DVT prophylaxis: lovenox GI prophylaxis: famotidine Glucose control: SSI, q4h Mobility: bed rest Code Status: full Family Communication: per [facility] Disposition: remain in ICU   Labs   CBC: Recent Labs  Lab 09/20/19 1415 09/20/19 1619 09/21/19 0400  WBC 8.5  --  8.3  NEUTROABS 7.5  --  7.4  HGB 12.9 13.3 13.1  HCT 40.4 39.0 41.5  MCV 94.2  --  95.8  PLT 196  --  [id number]    Basic Metabolic Panel: Recent Labs  Lab 09/20/19 1415 09/20/19 1619 09/21/19 0400  NA 137 133* 138  K 5.4* 4.2 3.6  CL 98  --  99  CO2 30  --  29  GLUCOSE 87  --  124*  BUN 62*  --  65*  CREATININE 1.33*  --  1.14*  CALCIUM 8.6*  --  8.6*  MG 2.5*  --  2.1  PHOS 4.0  --  4.1   GFR: Estimated Creatinine Clearance: 41.4 mL/min (A) (by C-G formula based on SCr of 1.14 mg/dL (H)). Recent Labs  Lab 09/20/19 1415 09/21/19 0400  WBC 8.5 8.3    Liver Function Tests: Recent Labs  Lab 09/20/19 1415 09/21/19 0400  AST 20 16  ALT 14 15  ALKPHOS 42 48  BILITOT 0.8 0.7  PROT 5.8* 6.0*  ALBUMIN 2.7* 2.6*   No results for input(s): LIPASE, AMYLASE in the last 168 hours. No results for input(s): AMMONIA in the last 168 hours.  ABG    Component Value Date/Time   PHART 7.417 09/20/2019 1619   PCO2ART 46.1 09/20/2019 1619   PO2ART 82.0 (L) 09/20/2019 1619   HCO3 29.4 (H) 09/20/2019 1619   TCO2 31 09/20/2019 1619   O2SAT 96.0 09/20/2019 1619     Coagulation Profile: No results for input(s): INR, PROTIME in the last 168 hours.  Cardiac Enzymes: No results for input(s): CKTOTAL, CKMB, CKMBINDEX, TROPONINI in the last 168 hours.  HbA1C: Hgb A1c MFr Bld  Date/Time Value Ref Range Status  09/20/2019 02:15 PM 6.1 (H) 4.8 - 5.6 % Final    Comment:    (NOTE) Pre diabetes:          5.7%-6.4% Diabetes:              >6.4% Glycemic control for   <7.0% adults with diabetes     CBG: Recent Labs  Lab 09/20/19 1528 09/20/19 1941 09/21/19 0426 09/21/19 0735  GLUCAP 70 122* 128* 125*     Critical care time: 40 minutes      Roselie Awkward, MD [place] [facility] Pager: [phone] Cell: [phone] If no response, call [phone]

## 2019-09-22 ENCOUNTER — Other Ambulatory Visit: Payer: Self-pay

## 2019-09-22 DIAGNOSIS — I429 Cardiomyopathy, unspecified: Secondary | ICD-10-CM | POA: Diagnosis not present

## 2019-09-22 DIAGNOSIS — J80 Acute respiratory distress syndrome: Secondary | ICD-10-CM | POA: Diagnosis not present

## 2019-09-22 DIAGNOSIS — J069 Acute upper respiratory infection, unspecified: Secondary | ICD-10-CM

## 2019-09-22 DIAGNOSIS — U071 COVID-19: Secondary | ICD-10-CM | POA: Diagnosis not present

## 2019-09-22 DIAGNOSIS — I739 Peripheral vascular disease, unspecified: Secondary | ICD-10-CM | POA: Diagnosis not present

## 2019-09-22 LAB — COMPREHENSIVE METABOLIC PANEL
ALT: 18 U/L (ref 0–44)
AST: 21 U/L (ref 15–41)
Albumin: 2.3 g/dL — ABNORMAL LOW (ref 3.5–5.0)
Alkaline Phosphatase: 50 U/L (ref 38–126)
Anion gap: 11 (ref 5–15)
BUN: 67 mg/dL — ABNORMAL HIGH (ref 8–23)
CO2: 33 mmol/L — ABNORMAL HIGH (ref 22–32)
Calcium: 9 mg/dL (ref 8.9–10.3)
Chloride: 97 mmol/L — ABNORMAL LOW (ref 98–111)
Creatinine, Ser: 1.29 mg/dL — ABNORMAL HIGH (ref 0.44–1.00)
GFR calc Af Amer: 45 mL/min — ABNORMAL LOW (ref >60)
GFR calc non Af Amer: 39 mL/min — ABNORMAL LOW (ref >60)
Glucose, Bld: 180 mg/dL — ABNORMAL HIGH (ref 70–99)
Potassium: 3.7 mmol/L (ref 3.5–5.1)
Sodium: 141 mmol/L (ref 135–145)
Total Bilirubin: 0.8 mg/dL (ref 0.3–1.2)
Total Protein: 5.9 g/dL — ABNORMAL LOW (ref 6.5–8.1)

## 2019-09-22 LAB — PHOSPHORUS: Phosphorus: 3.7 mg/dL (ref 2.5–4.6)

## 2019-09-22 LAB — CBC WITH DIFFERENTIAL/PLATELET
Abs Immature Granulocytes: 0.06 10*3/uL (ref 0.00–0.07)
Basophils Absolute: 0 10*3/uL (ref 0.0–0.1)
Basophils Relative: 0 %
Eosinophils Absolute: 0 10*3/uL (ref 0.0–0.5)
Eosinophils Relative: 0 %
HCT: 40.7 % (ref 36.0–46.0)
Hemoglobin: 12.7 g/dL (ref 12.0–15.0)
Immature Granulocytes: 1 %
Lymphocytes Relative: 3 %
Lymphs Abs: 0.3 10*3/uL — ABNORMAL LOW (ref 0.7–4.0)
MCH: 30 pg (ref 26.0–34.0)
MCHC: 31.2 g/dL (ref 30.0–36.0)
MCV: 96 fL (ref 80.0–100.0)
Monocytes Absolute: 0.6 10*3/uL (ref 0.1–1.0)
Monocytes Relative: 6 %
Neutro Abs: 9.4 10*3/uL — ABNORMAL HIGH (ref 1.7–7.7)
Neutrophils Relative %: 90 %
Platelets: 201 10*3/uL (ref 150–400)
RBC: 4.24 MIL/uL (ref 3.87–5.11)
RDW: 14.2 % (ref 11.5–15.5)
WBC: 10.5 10*3/uL (ref 4.0–10.5)
nRBC: 0 % (ref 0.0–0.2)

## 2019-09-22 LAB — MRSA PCR SCREENING: MRSA by PCR: NEGATIVE

## 2019-09-22 LAB — POCT I-STAT 7, (LYTES, BLD GAS, ICA,H+H)
Acid-Base Excess: 9 mmol/L — ABNORMAL HIGH (ref 0.0–2.0)
Bicarbonate: 33.4 mmol/L — ABNORMAL HIGH (ref 20.0–28.0)
Calcium, Ion: 1.25 mmol/L (ref 1.15–1.40)
HCT: 36 % (ref 36.0–46.0)
Hemoglobin: 12.2 g/dL (ref 12.0–15.0)
O2 Saturation: 92 %
Patient temperature: 37.9
Potassium: 3.7 mmol/L (ref 3.5–5.1)
Sodium: 141 mmol/L (ref 135–145)
TCO2: 35 mmol/L — ABNORMAL HIGH (ref 22–32)
pCO2 arterial: 46.1 mmHg (ref 32.0–48.0)
pH, Arterial: 7.471 — ABNORMAL HIGH (ref 7.350–7.450)
pO2, Arterial: 63 mmHg — ABNORMAL LOW (ref 83.0–108.0)

## 2019-09-22 LAB — GLUCOSE, CAPILLARY
Glucose-Capillary: 135 mg/dL — ABNORMAL HIGH (ref 70–99)
Glucose-Capillary: 156 mg/dL — ABNORMAL HIGH (ref 70–99)
Glucose-Capillary: 167 mg/dL — ABNORMAL HIGH (ref 70–99)
Glucose-Capillary: 180 mg/dL — ABNORMAL HIGH (ref 70–99)
Glucose-Capillary: 186 mg/dL — ABNORMAL HIGH (ref 70–99)

## 2019-09-22 LAB — PROCALCITONIN: Procalcitonin: <0.1 ng/mL

## 2019-09-22 LAB — MAGNESIUM: Magnesium: 2 mg/dL (ref 1.7–2.4)

## 2019-09-22 LAB — FERRITIN: Ferritin: 536 ng/mL — ABNORMAL HIGH (ref 11–307)

## 2019-09-22 LAB — D-DIMER, QUANTITATIVE: D-Dimer, Quant: 2.25 ug/mL-FEU — ABNORMAL HIGH (ref 0.00–0.50)

## 2019-09-22 LAB — C-REACTIVE PROTEIN: CRP: 18.4 mg/dL — ABNORMAL HIGH (ref <1.0)

## 2019-09-22 MED ORDER — SIMVASTATIN 20 MG PO TABS
20.0000 mg | ORAL_TABLET | Freq: Every day | ORAL | Status: DC
  Administered 2019-09-23 – 2019-10-06 (×13): 20 mg via ORAL
  Filled 2019-09-22 (×14): qty 1

## 2019-09-22 MED ORDER — CILOSTAZOL 50 MG PO TABS
50.0000 mg | ORAL_TABLET | Freq: Two times a day (BID) | ORAL | Status: DC
  Administered 2019-09-23 – 2019-10-07 (×28): 50 mg via ORAL
  Filled 2019-09-22 (×30): qty 1

## 2019-09-22 MED ORDER — METOPROLOL TARTRATE 5 MG/5ML IV SOLN
5.0000 mg | Freq: Four times a day (QID) | INTRAVENOUS | Status: DC | PRN
  Administered 2019-09-22 – 2019-10-06 (×9): 5 mg via INTRAVENOUS
  Filled 2019-09-22 (×10): qty 5

## 2019-09-22 MED ORDER — AMIODARONE HCL IN DEXTROSE 360-4.14 MG/200ML-% IV SOLN
60.0000 mg/h | INTRAVENOUS | Status: DC
  Administered 2019-09-22: 60 mg/h via INTRAVENOUS
  Filled 2019-09-22: qty 200

## 2019-09-22 MED ORDER — TRAZODONE HCL 50 MG PO TABS
50.0000 mg | ORAL_TABLET | Freq: Every day | ORAL | Status: DC
  Administered 2019-09-23 – 2019-09-28 (×6): 50 mg via ORAL
  Filled 2019-09-22 (×7): qty 1

## 2019-09-22 MED ORDER — ENOXAPARIN SODIUM 80 MG/0.8ML ~~LOC~~ SOLN
70.0000 mg | Freq: Two times a day (BID) | SUBCUTANEOUS | Status: DC
  Administered 2019-09-22 – 2019-09-28 (×12): 70 mg via SUBCUTANEOUS
  Filled 2019-09-22 (×12): qty 0.8

## 2019-09-22 MED ORDER — AMIODARONE LOAD VIA INFUSION
150.0000 mg | Freq: Once | INTRAVENOUS | Status: AC
  Administered 2019-09-22: 150 mg via INTRAVENOUS
  Filled 2019-09-22: qty 83.34

## 2019-09-22 MED ORDER — POTASSIUM CHLORIDE 20 MEQ/15ML (10%) PO SOLN
40.0000 meq | Freq: Two times a day (BID) | ORAL | Status: AC
  Administered 2019-09-22 – 2019-09-23 (×2): 40 meq
  Filled 2019-09-22 (×2): qty 30

## 2019-09-22 MED ORDER — AMIODARONE HCL IN DEXTROSE 360-4.14 MG/200ML-% IV SOLN: 30.0000 mg/h | INTRAVENOUS | Status: DC

## 2019-09-22 MED ORDER — FUROSEMIDE 10 MG/ML IJ SOLN
40.0000 mg | Freq: Four times a day (QID) | INTRAMUSCULAR | Status: AC
  Administered 2019-09-22 (×2): 40 mg via INTRAVENOUS
  Filled 2019-09-22 (×2): qty 4

## 2019-09-22 MED ORDER — METOPROLOL TARTRATE 25 MG/10 ML ORAL SUSPENSION
25.0000 mg | Freq: Two times a day (BID) | ORAL | Status: DC
  Administered 2019-09-22 – 2019-09-27 (×9): 25 mg
  Filled 2019-09-22 (×11): qty 10

## 2019-09-22 MED ORDER — FAMOTIDINE 40 MG/5ML PO SUSR
20.0000 mg | Freq: Every day | ORAL | Status: DC
  Administered 2019-09-23: 20 mg
  Filled 2019-09-22: qty 2.5

## 2019-09-22 NOTE — Progress Notes (Signed)
NAME:  Christie Harper, MRN:  [id number], DOB:  Dec 02, 1940, LOS: 2 ADMISSION DATE:  09/20/2019, CONSULTATION DATE:  12/2 REFERRING MD:  [facility] MD, CHIEF COMPLAINT:  dyspnea   Brief History   78 year old female initially admitted to [facility] on November 21 in the setting of mild hypoxemia and diarrhea.  Covid test became positive on November 22.  Treated with Decadron for 10 days, remdesivir 5 days.  Hypoxemia progressed.  Intubated on November 30.  Transferred to [facility] for further management by pulmonary critical care on December 2.  Past Medical History  Hypertension CKD III Breast cancer s/p mastectomy and chemotherapy Former smoker, quit 2008 Bilateral femoral and iliac stents Carotid stenosis L complete, 50% R Unprovoked DVT on [facility]   11/21 admit [place] hospital diarrhea, mild hypoxemia 11/22 COVID test positive, started remdesivir and decadron 11/24 4-8 L O2 11/30 started heated high flow O2, new fever, intubated 12/2 transfer to [facility]  Consults:    Procedures:  11/30 ETT >  11/30 R arm PICC >   Significant Diagnostic Tests:  12/3 Echo> LVEF 35-40%, mild LVH, inf/sept akinesis, RV not well visualized 12/3 diuresed, significant improvement in oxygenation  Micro Data:  11/19 SARS COV 2 > positive (performed at her PCP office) 12/2 resp culture>   Antimicrobials:  Decadron 11/22> 12/1 Remdesivir 11/22> 11/26 Ceftriaxone 11/24 > 11/28 Azithro 11/24 > 11/28   Interim history/subjective:   Significant improvement in oxygenation Diuresed yesterday Follows commands Atrial fib yesterday treated with metop Failed SBT due to low respiratory rate  Objective   Blood pressure (!) 146/61, pulse (!) 126, temperature (!) 100.4 F (38 C), resp. rate (!) 21, height 5\' 7"  (1.702 m), weight 71.9 kg, SpO2 90 %.    Vent Mode: PRVC FiO2 (%):  [40 %-50 %] 40 % Set Rate:  [20 bmp] 20 bmp Vt Set:  [450  mL] 450 mL PEEP:  [8 cmH20-10 cmH20] 8 cmH20 Plateau Pressure:  [17 cmH20-19 cmH20] 17 cmH20   Intake/Output Summary (Last 24 hours) at 09/22/2019 [id number] Last data filed at 09/22/2019 0600 Gross per 24 hour  Intake 1593.88 ml  Output 3425 ml  Net -1831.12 ml   Filed Weights   09/20/19 1230 09/22/19 0500  Weight: 74 kg 71.9 kg    Examination:  General:  In bed on vent HENT: NCAT ETT in place PULM: Crackles bases B, vent supported breathing CV: RRR, no mgr GI: BS+, soft, nontender MSK: normal bulk and tone Neuro: will wake up, open eyes, nod head appropriately   12/2 CXR images personally reviewed: bilateral R>L interstitial infiltrate, ETT in place  Resolved Hospital Problem list     Assessment & Plan:   ARDS COVID pneumonia: treated with steroids and remdesivir at OSH; unclear severity at this point Suspect HCAP Continue mechanical ventilation per ARDS protocol Target TVol 6-8cc/kgIBW Target Plateau Pressure < 30cm H20 Target driving pressure less than 15 cm of water Target PaO2 55-65: titrate PEEP/FiO2 per protocol As long as PaO2 to FiO2 ratio is less than 1:150 position in prone position for 16 hours a day Check CVP daily if CVL in place Target CVP less than 4, diurese as necessary Ventilator associated pneumonia prevention protocol 12/4 cefepime 5 days, lasix again today, wean on pressures support as long as tolerated, would like to see HR under better control prior to extubation  Atrial fib: new problem Metoprolol Amiodarone infusion> start today 12 lead  Systolic heart failure, chronic  Lasix to continue today Continue metoprolol Tele  CKD, stage III Monitor BMET and UOP Replace electrolytes as needed Continue lasix  Baseline hypertension Continue metoprolol, change to oral   Need for sedation for mechanical ventilation PAD protocol, RASS goal -1 to -2 Fentanyl infusion, prn versed  History of unprovoked DVT/PE Full dose lovenox  Peripheral  arterial disease/carotid artery disease pletal per home regimen   Best practice:  Diet: start tube feeding Pain/Anxiety/Delirium protocol (if indicated): as above VAP protocol (if indicated): yes DVT prophylaxis: lovenox GI prophylaxis: famotidine Glucose control: SSI, q4h Mobility: bed rest Code Status: full Family Communication: per [facility] Disposition: remain in ICU  Labs   CBC: Recent Labs  Lab 09/20/19 1415 09/20/19 1619 09/21/19 0400 09/22/19 0345  WBC 8.5  --  8.3 10.5  NEUTROABS 7.5  --  7.4 9.4*  HGB 12.9 13.3 13.1 12.7  HCT 40.4 39.0 41.5 40.7  MCV 94.2  --  95.8 96.0  PLT 196  --  197 [id number]    Basic Metabolic Panel: Recent Labs  Lab 09/20/19 1415 09/20/19 1619 09/21/19 0400 09/21/19 1630 09/22/19 0345  NA 137 133* 138  --  141  K 5.4* 4.2 3.6  --  3.7  CL 98  --  99  --  97*  CO2 30  --  29  --  33*  GLUCOSE 87  --  124*  --  180*  BUN 62*  --  65*  --  67*  CREATININE 1.33*  --  1.14*  --  1.29*  CALCIUM 8.6*  --  8.6*  --  9.0  MG 2.5*  --  2.1 1.9 2.0  PHOS 4.0  --  4.1 3.5 3.7   GFR: Estimated Creatinine Clearance: 33.8 mL/min (A) (by C-G formula based on SCr of 1.29 mg/dL (H)). Recent Labs  Lab 09/20/19 1415 09/21/19 0400 09/21/19 0830 09/22/19 0345  PROCALCITON  --   --  <0.10 <0.10  WBC 8.5 8.3  --  10.5    Liver Function Tests: Recent Labs  Lab 09/20/19 1415 09/21/19 0400 09/22/19 0345  AST 20 16 21   ALT 14 15 18   ALKPHOS 42 48 50  BILITOT 0.8 0.7 0.8  PROT 5.8* 6.0* 5.9*  ALBUMIN 2.7* 2.6* 2.3*   No results for input(s): LIPASE, AMYLASE in the last 168 hours. No results for input(s): AMMONIA in the last 168 hours.  ABG    Component Value Date/Time   PHART 7.417 09/20/2019 1619   PCO2ART 46.1 09/20/2019 1619   PO2ART 82.0 (L) 09/20/2019 1619   HCO3 29.4 (H) 09/20/2019 1619   TCO2 31 09/20/2019 1619   O2SAT 96.0 09/20/2019 1619     Coagulation Profile: No results for input(s): INR, PROTIME in the last 168 hours.   Cardiac Enzymes: No results for input(s): CKTOTAL, CKMB, CKMBINDEX, TROPONINI in the last 168 hours.  HbA1C: Hgb A1c MFr Bld  Date/Time Value Ref Range Status  09/20/2019 02:15 PM 6.1 (H) 4.8 - 5.6 % Final    Comment:    (NOTE) Pre diabetes:          5.7%-6.4% Diabetes:              >6.4% Glycemic control for   <7.0% adults with diabetes     CBG: Recent Labs  Lab 09/21/19 1553 09/21/19 2001 09/21/19 2341 09/22/19 0336 09/22/19 0805  GLUCAP 185* 157* 151* 167* 180*     Critical care time: 35 minutes     Roselie Awkward,  MD  PCCM Pager: [phone] Cell: [phone] If no response, call [phone]

## 2019-09-22 NOTE — Progress Notes (Signed)
PT Cancellation Note  Patient Details Name: Christie Harper MRN: [id number] DOB: 1941/05/31   Cancelled Treatment:    Reason Eval/Treat Not Completed: Other (comment)  PT orders received in the PM on Friday 09/22/19.  PT will not be able to evaluate the pt until Monday 09/25/19 unless she moves down to the progressive care unit.  If PT needs to see pt before then please call: [phone] or secure chat Delford Field, PT Saturday or Earma Reading, OT Sunday.  Thanks,  Verdene Lennert, PT, DPT  Acute Rehabilitation [phone] pager [phone] office  @ [facility]: [phone]     [name] 09/22/2019, 4:04 PM

## 2019-09-22 NOTE — Procedures (Signed)
Extubation Procedure Note  Patient Details:   Name: Christie Harper DOB: [birth year] MRN: [id number]   Airway Documentation:  Airway 7.5 mm (Active)  Secured at (cm) 23 cm 09/22/19 0755  Measured From Lips 09/22/19 0755  Secured Location Left 09/22/19 0755  Secured By [employer] 09/22/19 0755  Tube Holder Repositioned Yes 09/22/19 0755  Cuff Pressure (cm H2O) 28 cm H2O 09/22/19 0216  Site Condition Dry 09/22/19 0755   Vent end date: (not recorded) Vent end time: (not recorded)   Evaluation  O2 sats: stable throughout Complications: No apparent complications Patient did tolerate procedure well. Bilateral Breath Sounds: Rhonchi, Diminished   Yes  Leak test positive for air flow.  No stridor noted. Bilateral breath sounds present.   Obdulia Steier 09/22/2019, 3:35 PM

## 2019-09-22 NOTE — Progress Notes (Signed)
Updated sister Christie Harper.

## 2019-09-22 NOTE — Progress Notes (Signed)
[facility] PCCM  Awake, following commands Passing SBT Extubate  Roselie Awkward, MD [place] [facility] Pager: [phone] Cell: [phone] If no response, call [phone]

## 2019-09-22 NOTE — Progress Notes (Signed)
Called and updated pt's sister Christie Harper on today's events including successful extubation and difficulties getting HR WNL. She was glad to hear from [place] and spoke to pt over speakerphone, understanding that she was too hoarse to speak back yet.

## 2019-09-22 NOTE — Progress Notes (Signed)
  Amiodarone Drug - Drug Interaction Consult Note  Recommendations: Amiodarone is metabolized by the cytochrome P450 system and therefore has the potential to cause many drug interactions. Amiodarone has an average plasma half-life of 50 days (range 20 to 100 days).   There is potential for drug interactions to occur several weeks or months after stopping treatment and the onset of drug interactions may be slow after initiating amiodarone.   [x]  Statins: Increased risk of myopathy. Simvastatin- restrict dose to 20mg  daily. Other statins: counsel patients to report any muscle pain or weakness immediately. - Simvastatin 20 mg PO daily - already on appropriate dose.  []  Anticoagulants: Amiodarone can increase anticoagulant effect. Consider warfarin dose reduction. Patients should be monitored closely and the dose of anticoagulant altered accordingly, remembering that amiodarone levels take several weeks to stabilize.  []  Antiepileptics: Amiodarone can increase plasma concentration of phenytoin, the dose should be reduced. Note that small changes in phenytoin dose can result in large changes in levels. Monitor patient and counsel on signs of toxicity.  [x]  Beta blockers: increased risk of bradycardia, AV block and myocardial depression. Sotalol - avoid concomitant use.  - Metoprolol 25 per tube BID.  []   Calcium channel blockers (diltiazem and verapamil): increased risk of bradycardia, AV block and myocardial depression.  []   Cyclosporine: Amiodarone increases levels of cyclosporine. Reduced dose of cyclosporine is recommended.  []  Digoxin dose should be halved when amiodarone is started.  [x]  Diuretics: increased risk of cardiotoxicity if hypokalemia occurs.  - Lasix 40 x2 doses,  K 3.7 - replacing  []  Oral hypoglycemic agents (glyburide, glipizide, glimepiride): increased risk of hypoglycemia. Patient's glucose levels should be monitored closely when initiating amiodarone therapy.    []  Drugs that prolong the QT interval:  Torsades de pointes risk may be increased with concurrent use - avoid if possible.  Monitor QTc, also keep magnesium/potassium WNL if concurrent therapy can't be avoided. Marland Kitchen Antibiotics: e.g. fluoroquinolones, erythromycin. . Antiarrhythmics: e.g. quinidine, procainamide, disopyramide, sotalol. . Antipsychotics: e.g. phenothiazines, haloperidol.  . Lithium, tricyclic antidepressants, and methadone.   Thank You,   Gretta Arab PharmD, BCPS Clinical pharmacist phone 7am- 5pm: [phone] 09/22/2019 10:27 AM

## 2019-09-22 NOTE — Progress Notes (Addendum)
PROGRESS NOTE                                                                                                                                                                                                             Patient Demographics:    Christie Harper, is a 78 y.o. female, DOB - Oct 11, 1941, MBB:[id number]  Admit date - 09/20/2019   Admitting Physician Juanito Doom, MD  Outpatient Primary MD for the patient is No primary care provider on file.  LOS - 2   No chief complaint on file.      Brief Narrative    78 year old femalewith a history of HTN, severe PAD (Pletal) s/p bilateral iliac and femoral stenting, carotid stenosis (complete L occlusion, 50% R), unprovoked DVT/PE (Xarelto indefinitely), CKD III, h/o BRCA s/pmastectomy and chemo, former tobacco use (quit 2008).  initially admitted to [facility] on November 21 in the setting of mild hypoxemia and diarrhea.  Covid test became positive on November 22.  Treated with Decadron for 10 days, remdesivir 5 days.  Hypoxemia progressed.  Intubated on November 30.  Transferred to [facility] for further management by pulmonary critical care on December   11/21 admit [facility] diarrhea, mild hypoxemia 11/22 COVID test positive, started remdesivir and decadron 11/24 4-8 L O2 11/30 started heated high flow O2, new fever, intubated 12/2 transfer to [facility]   Subjective:    Christie Harper today remains sedated, intubated, did develop A. fib with RVR overnight.  Assessment  & Plan :    Active Problems:   COVID-19   Peripheral arterial disease ([place])   Hypertension   Former smoker   Carotid artery disease ([place])   Cardiomyopathy ([place])   Breast cancer ([place])  Acute hypoxic respiratory failure/ARDS due to COVID-19 pneumonia -Patient required intubation at OSH, transferred to [facility] for [facility] management. -Vent management per PCCM, on ARDS protocol,  sedation per PCCM as well. -On diuresis as needed, received Lasix today per PCCM.  Monitor CVP -On cefepime, to finish total of 5 days given she still febrile. -She was treated with remdesivir. -Continue with steroids. -she is late  her disease to consider Actemra or plasma. -Patient remains febrile, follow-up blood cultures, on cefepime empirically. -Procalcitonin within normal limit. -continue With diuresis.   [place]  09/20/19 1415 09/21/19 0400 09/22/19 0345  DDIMER 2.42* 2.43* 2.25*  FERRITIN 524* 535* 536*  LDH 225*  --   --   CRP 3.8* 6.4* 18.4*   New onset A. Fib -Heart rate significantly uncontrolled, started on amiodarone drip. -She is already on full dose anticoagulation. -Started on metoprolol 25 mg twice daily via tube.  Chronic systolic CHF -EF 84% with apical akinesis, she is already on full dose Lovenox for known history of PE/DVT. -Per outpatient cardiologist note, most recent cardiac cath 2012, significant for nonobstructive 60% mid LAD. -On IV Lasix, monitor volume status closely.  Hyperlipidemia -Continue with statin  History of PE/DVT -Unprovoked, continue with anticoagulation, on Xarelto as outpatient, currently on Lovenox during hospital stay.  History of CAD -Please see above discussion, 2D echo with apical akinesis,  continue with statin, and beta-blockers.  History of severe PVD/carotid artery disease -Status post multiple stenting, continue with Pletal.  Goals of care:  -Cussed with her sister, patient has no children, she is her next of kin, and discussed goals of care, patient sister would like to continue with full medical management currently, including vent support, and pressors as needed, but agreeable to patient no CPR, no CODE BLUE, but if patient need to be reintubated should be performed.      Code Status : Patient is partial code, no CPR, no CODE BLUE, but she can be reintubated electively if indicated or  does not do well after extubation.  Family Communication  : Updated sister via phone  Disposition Plan  : Remains in ICU  Consults  :  PCCM  Procedures  :  None  DVT Prophylaxis  :  Lovenox  Lab Results  Component Value Date   PLT 201 09/22/2019    Antibiotics  :    Anti-infectives (From admission, onward)   Start     Dose/Rate Route Frequency Ordered Stop   09/21/19 1100  ceFEPIme (MAXIPIME) 2 g in sodium chloride 0.9 % 100 mL IVPB     2 g 200 mL/hr over 30 Minutes Intravenous Every 12 hours 09/21/19 1022          Objective:   Vitals:   09/22/19 1245 09/22/19 1256 09/22/19 1300 09/22/19 1315  BP: (!) 115/57 (!) 115/57 (!) 164/82 138/72  Pulse: (!) 124 (!) 126 (!) 129 (!) 122  Resp: _0 Temp: 100.2 F (37.9 C)  100.2 F (37.9 C) 100.2 F (37.9 C)  TempSrc:      SpO2: 90%  90% 92%  Weight:      Height:        Wt Readings from Last 3 Encounters:  09/22/19 71.9 kg     Intake/Output Summary (Last 24 hours) at 09/22/2019 1431 Last data filed at 09/22/2019 1312 Gross per 24 hour  Intake 1509.03 ml  Output 3595 ml  Net -2085.97 ml     Physical Exam  Sedated, intubated Symmetrical Chest wall movement, Good air movement bilaterally, ventilatory supported respiratory sounds Irregular irregular, tachycardic,No Gallops,Rubs , No Parasternal Heave +ve B.Sounds, Abd Soft, No tenderness, No rebound - guarding or rigidity. No Cyanosis, Clubbing or edema, No new Rash or bruise      Data Review:    CBC Recent Labs  Lab 09/20/19 1415 09/20/19 1619 09/21/19 0400 09/22/19 0345 09/22/19 1032  WBC 8.5  --  8.3 10.5  --   HGB 12.9 13.3 13.1 12.7 12.2  HCT 40.4 39.0 41.5 40.7 36.0  PLT 196  --  197 201  --   MCV 94.2  --  95.8 96.0  --   MCH 30.1  --  30.3 30.0  --   MCHC 31.9  --  31.6 31.2  --   RDW 13.9  --  14.1 14.2  --   LYMPHSABS 0.3*  --  0.3* 0.3*  --   MONOABS 0.7  --  0.6 0.6  --   EOSABS 0.0  --  0.0 0.0  --   BASOSABS 0.0  --  0.0 0.0   --     Chemistries  Recent Labs  Lab 09/20/19 1415 09/20/19 1619 09/21/19 0400 09/21/19 1630 09/22/19 0345 09/22/19 1032  NA 137 133* 138  --  141 141  K 5.4* 4.2 3.6  --  3.7 3.7  CL 98  --  99  --  97*  --   CO2 30  --  29  --  33*  --   GLUCOSE 87  --  124*  --  180*  --   BUN 62*  --  65*  --  67*  --   CREATININE 1.33*  --  1.14*  --  1.29*  --   CALCIUM 8.6*  --  8.6*  --  9.0  --   MG 2.5*  --  2.1 1.9 2.0  --   AST 20  --  16  --  21  --   ALT 14  --  15  --  18  --   ALKPHOS 42  --  48  --  50  --   BILITOT 0.8  --  0.7  --  0.8  --    ------------------------------------------------------------------------------------------------------------------ No results for input(s): CHOL, HDL, LDLCALC, TRIG, CHOLHDL, LDLDIRECT in the last 72 hours.  Lab Results  Component Value Date   HGBA1C 6.1 (H) 09/20/2019   ------------------------------------------------------------------------------------------------------------------ No results for input(s): TSH, T4TOTAL, T3FREE, THYROIDAB in the last 72 hours.  Invalid input(s): FREET3 ------------------------------------------------------------------------------------------------------------------ Recent Labs    09/21/19 0400 09/22/19 0345  FERRITIN 535* 536*    Coagulation profile No results for input(s): INR, PROTIME in the last 168 hours.  Recent Labs    09/21/19 0400 09/22/19 0345  DDIMER 2.43* 2.25*    Cardiac Enzymes No results for input(s): CKMB, TROPONINI, MYOGLOBIN in the last 168 hours.  Invalid input(s): CK ------------------------------------------------------------------------------------------------------------------    Component Value Date/Time   BNP 29.1 09/20/2019 1415    Inpatient Medications  Scheduled Meds: . chlorhexidine gluconate (MEDLINE KIT)  15 mL Mouth Rinse BID  . Chlorhexidine Gluconate Cloth  6 each Topical Daily  . cilostazol  50 mg Oral BID  . enoxaparin (LOVENOX)  injection  70 mg Subcutaneous Q12H  . [START ON 09/23/2019] famotidine  20 mg Per Tube Daily  . feeding supplement (PRO-STAT SUGAR FREE 64)  30 mL Per Tube TID  . fentaNYL (SUBLIMAZE) injection  25 mcg Intravenous Once  . furosemide  40 mg Intravenous Q6H  . insulin aspart  0-9 Units Subcutaneous Q4H  . mouth rinse  15 mL Mouth Rinse 10 times per day  . metoprolol tartrate  25 mg Per Tube BID  . potassium chloride  40 mEq Per Tube BID  . simvastatin  20 mg Oral QHS  . sodium chloride HYPERTONIC  4 mL Nebulization Q12H  . traZODone  50 mg Oral QHS  . vitamin C  500 mg Per Tube Daily  . zinc sulfate  220 mg Per Tube Daily   Continuous Infusions: .  amiodarone 60 mg/hr (09/22/19 1300)   Followed by  . amiodarone    . ceFEPime (MAXIPIME) IV Stopped (09/22/19 9470)  . dexmedetomidine (PRECEDEX) IV infusion Stopped (09/20/19 2332)  . feeding supplement (VITAL 1.5 CAL) 1,000 mL (09/20/19 1739)  . fentaNYL infusion INTRAVENOUS Stopped (09/22/19 0954)  . phenylephrine (NEO-SYNEPHRINE) Adult infusion Stopped (09/21/19 0348)   PRN Meds:.acetaminophen, fentaNYL, midazolam, midazolam, ondansetron (ZOFRAN) IV  Micro Results Recent Results (from the past 240 hour(s))  Culture, blood (routine x 2)     Status: None (Preliminary result)   Collection Time: 09/21/19  8:40 AM   Specimen: BLOOD  Result Value Ref Range Status   Specimen Description   Final    BLOOD RIGHT HAND Performed at [facility] [address], [place], [place] [address]    Special Requests   Final    BOTTLES DRAWN AEROBIC AND ANAEROBIC Blood Culture adequate volume Performed at [facility] [address] [place], [place] [address]    Culture   Final    NO GROWTH < 24 HOURS Performed at [facility] [address], [place], [place] [address]    Report Status PENDING  Incomplete  Culture, blood (routine x 2)     Status: None (Preliminary result)   Collection Time: 09/21/19   8:50 AM   Specimen: BLOOD  Result Value Ref Range Status   Specimen Description   Final    BLOOD RIGHT HAND Performed at [facility] [address] [place], [place] [address]    Special Requests   Final    BOTTLES DRAWN AEROBIC ONLY Blood Culture results may not be optimal due to an inadequate volume of blood received in culture bottles   Culture   Final    NO GROWTH < 24 HOURS Performed at [facility] [address], [place], [place] [address]    Report Status PENDING  Incomplete  MRSA PCR Screening     Status: None   Collection Time: 09/22/19  1:35 AM   Specimen: Nasopharyngeal  Result Value Ref Range Status   MRSA by PCR NEGATIVE NEGATIVE Final    Comment:        The GeneXpert MRSA Assay (FDA approved for NASAL specimens only), is one component of a comprehensive MRSA colonization surveillance program. It is not intended to diagnose MRSA infection nor to guide or monitor treatment for MRSA infections. Performed at [facility], [place] [address], [place], [place] [address]     Radiology Reports Portable Chest 1 View  Result Date: 09/20/2019 CLINICAL DATA:  Shortness of breath EXAM: PORTABLE CHEST 1 VIEW COMPARISON:  None. FINDINGS: Patchy areas of interstitial airspace disease throughout the right lung and to lesser extent left mid lung. No pleural effusion or pneumothorax. Stable cardiomediastinal silhouette. No aggressive osseous lesion. Endotracheal tube with the tip 6 cm above the carina. Right-sided PICC line with the tip projecting over the SVC. Nasogastric tube coursing below the diaphragm. No acute osseous abnormality. IMPRESSION: 1. Patchy areas of interstitial airspace disease throughout the right lung and to lesser extent left mid lung concerning for atypical viral pneumonia. Electronically Signed   By: Kathreen Devoid   On: 09/20/2019 13:45      Phillips Climes M.D on 09/22/2019 at 2:31 PM  Between 7am to 7pm - Pager  - [phone]  After 7pm go to [email or web address] - password [facility]  [facility] -  Office  [phone]

## 2019-09-22 NOTE — TOC Initial Note (Signed)
Transition of Care [facility]) - Initial/Assessment Note    Patient Details  Name: Christie Harper MRN: [id number] Date of Birth: [birth year]  Transition of Care [facility]) CM/SW Contact:    Ninfa Meeker, RN Phone Number: 09/22/2019, 3:09 PM  Clinical Narrative:      Patient is 78 year-old female initially admitted to [facility] on November 21 in the setting of mild hypoxemia and diarrhea.  Covid test became positive on November 22.  Treated with Decadron for 10 days, remdesivir 5 days.  Hypoxemia progressed.  Intubated on November 30.  and transferred to [facility] ICU December 2. Patient was extubated today. Case manager will continue to monitor.                        Patient Goals and CMS Choice        Expected Discharge Plan and Services                                                Prior Living Arrangements/Services                       Activities of Daily Living Home Assistive Devices/Equipment: [place] Badder) ADL Screening (condition at time of admission) Patient's cognitive ability adequate to safely complete daily activities?: Yes Is the patient deaf or have difficulty hearing?: No Does the patient have difficulty seeing, even when wearing glasses/contacts?: No Does the patient have difficulty concentrating, remembering, or making decisions?: No Patient able to express need for assistance with ADLs?: Yes Does the patient have difficulty dressing or bathing?: No Independently performs ADLs?: Yes (appropriate for developmental age) Does the patient have difficulty walking or climbing stairs?: No Weakness of Legs: None Weakness of Arms/Hands: None  Permission Sought/Granted                  Emotional Assessment              Admission diagnosis:  COVID 19 RESP. FAILURE Patient Active Problem List   Diagnosis Date Noted  . COVID-19 09/20/2019  . Peripheral arterial disease ([place])   . Hypertension   . Former smoker   . Carotid artery  disease ([place])   . Cardiomyopathy ([place])   . Breast cancer [facility])    PCP:  No primary care provider on file. Pharmacy:   [facility], [place] [address] [place] [address] and [place] [address] Phone: [phone] Fax: [phone]     Social Determinants of Health (SDOH) Interventions    Readmission Risk Interventions No flowsheet data found.

## 2019-09-23 ENCOUNTER — Inpatient Hospital Stay (HOSPITAL_COMMUNITY): Payer: Medicare Other

## 2019-09-23 DIAGNOSIS — R0902 Hypoxemia: Secondary | ICD-10-CM

## 2019-09-23 LAB — COMPREHENSIVE METABOLIC PANEL
ALT: 24 U/L (ref 0–44)
AST: 27 U/L (ref 15–41)
Albumin: 2.6 g/dL — ABNORMAL LOW (ref 3.5–5.0)
Alkaline Phosphatase: 58 U/L (ref 38–126)
Anion gap: 15 (ref 5–15)
BUN: 70 mg/dL — ABNORMAL HIGH (ref 8–23)
CO2: 37 mmol/L — ABNORMAL HIGH (ref 22–32)
Calcium: 9.6 mg/dL (ref 8.9–10.3)
Chloride: 96 mmol/L — ABNORMAL LOW (ref 98–111)
Creatinine, Ser: 1.3 mg/dL — ABNORMAL HIGH (ref 0.44–1.00)
GFR calc Af Amer: 45 mL/min — ABNORMAL LOW (ref >60)
GFR calc non Af Amer: 39 mL/min — ABNORMAL LOW (ref >60)
Glucose, Bld: 118 mg/dL — ABNORMAL HIGH (ref 70–99)
Potassium: 4.3 mmol/L (ref 3.5–5.1)
Sodium: 148 mmol/L — ABNORMAL HIGH (ref 135–145)
Total Bilirubin: 0.6 mg/dL (ref 0.3–1.2)
Total Protein: 6.9 g/dL (ref 6.5–8.1)

## 2019-09-23 LAB — CBC WITH DIFFERENTIAL/PLATELET
Abs Immature Granulocytes: 0.07 10*3/uL (ref 0.00–0.07)
Basophils Absolute: 0 10*3/uL (ref 0.0–0.1)
Basophils Relative: 0 %
Eosinophils Absolute: 0 10*3/uL (ref 0.0–0.5)
Eosinophils Relative: 0 %
HCT: 43.2 % (ref 36.0–46.0)
Hemoglobin: 13.8 g/dL (ref 12.0–15.0)
Immature Granulocytes: 1 %
Lymphocytes Relative: 7 %
Lymphs Abs: 0.8 10*3/uL (ref 0.7–4.0)
MCH: 30.2 pg (ref 26.0–34.0)
MCHC: 31.9 g/dL (ref 30.0–36.0)
MCV: 94.5 fL (ref 80.0–100.0)
Monocytes Absolute: 0.7 10*3/uL (ref 0.1–1.0)
Monocytes Relative: 6 %
Neutro Abs: 8.9 10*3/uL — ABNORMAL HIGH (ref 1.7–7.7)
Neutrophils Relative %: 86 %
Platelets: 230 10*3/uL (ref 150–400)
RBC: 4.57 MIL/uL (ref 3.87–5.11)
RDW: 14 % (ref 11.5–15.5)
WBC: 10.4 10*3/uL (ref 4.0–10.5)
nRBC: 0 % (ref 0.0–0.2)

## 2019-09-23 LAB — GLUCOSE, CAPILLARY
Glucose-Capillary: 121 mg/dL — ABNORMAL HIGH (ref 70–99)
Glucose-Capillary: 114 mg/dL — ABNORMAL HIGH (ref 70–99)
Glucose-Capillary: 121 mg/dL — ABNORMAL HIGH (ref 70–99)
Glucose-Capillary: 138 mg/dL — ABNORMAL HIGH (ref 70–99)
Glucose-Capillary: 99 mg/dL (ref 70–99)
Glucose-Capillary: 167 mg/dL — ABNORMAL HIGH (ref 70–99)
Glucose-Capillary: 123 mg/dL — ABNORMAL HIGH (ref 70–99)

## 2019-09-23 LAB — C-REACTIVE PROTEIN: CRP: 19.4 mg/dL — ABNORMAL HIGH (ref <1.0)

## 2019-09-23 LAB — D-DIMER, QUANTITATIVE: D-Dimer, Quant: 2.42 ug/mL-FEU — ABNORMAL HIGH (ref 0.00–0.50)

## 2019-09-23 LAB — MAGNESIUM: Magnesium: 2.2 mg/dL (ref 1.7–2.4)

## 2019-09-23 LAB — FERRITIN: Ferritin: 746 ng/mL — ABNORMAL HIGH (ref 11–307)

## 2019-09-23 LAB — PHOSPHORUS: Phosphorus: 2.7 mg/dL (ref 2.5–4.6)

## 2019-09-23 LAB — PROCALCITONIN: Procalcitonin: 0.13 ng/mL

## 2019-09-23 MED ORDER — ASPIRIN EC 81 MG PO TBEC: 81.0000 mg | DELAYED_RELEASE_TABLET | Freq: Every day | ORAL | Status: DC

## 2019-09-23 MED ORDER — ALPRAZOLAM 0.25 MG PO TABS
0.2500 mg | ORAL_TABLET | Freq: Two times a day (BID) | ORAL | Status: DC | PRN
  Administered 2019-09-23 – 2019-09-27 (×7): 0.25 mg via ORAL
  Filled 2019-09-23 (×8): qty 1

## 2019-09-23 MED ORDER — SENNOSIDES-DOCUSATE SODIUM 8.6-50 MG PO TABS
3.0000 | ORAL_TABLET | Freq: Two times a day (BID) | ORAL | Status: DC
  Administered 2019-09-23 – 2019-10-07 (×17): 3 via ORAL
  Filled 2019-09-23 (×25): qty 3

## 2019-09-23 MED ORDER — ASPIRIN 81 MG PO CHEW
81.0000 mg | CHEWABLE_TABLET | Freq: Once | ORAL | Status: AC
  Administered 2019-09-23: 81 mg via ORAL
  Filled 2019-09-23: qty 1

## 2019-09-23 MED ORDER — BISACODYL 5 MG PO TBEC
10.0000 mg | DELAYED_RELEASE_TABLET | Freq: Once | ORAL | Status: AC
  Administered 2019-09-23: 10 mg via ORAL
  Filled 2019-09-23: qty 2

## 2019-09-23 MED ORDER — FUROSEMIDE 10 MG/ML IJ SOLN
40.0000 mg | Freq: Once | INTRAMUSCULAR | Status: AC
  Administered 2019-09-23: 40 mg via INTRAVENOUS
  Filled 2019-09-23: qty 4

## 2019-09-23 MED ORDER — ORAL CARE MOUTH RINSE
15.0000 mL | Freq: Two times a day (BID) | OROMUCOSAL | Status: DC
  Administered 2019-09-23 – 2019-10-07 (×26): 15 mL via OROMUCOSAL

## 2019-09-23 MED ORDER — INSULIN ASPART 100 UNIT/ML ~~LOC~~ SOLN
0.0000 [IU] | SUBCUTANEOUS | Status: DC
  Administered 2019-09-25: 1 [IU] via SUBCUTANEOUS

## 2019-09-23 MED ORDER — DEXTROSE 5 % IV SOLN
INTRAVENOUS | Status: DC
  Administered 2019-09-23: 18:00:00 via INTRAVENOUS

## 2019-09-23 NOTE — Progress Notes (Signed)
Report from dayshift RN that patient had expressive aphasia since taking over care at 1500.   Upon nightshift 2000 assessment, patient with expressive aphasia. She is able to tell me her first name and then also sounds as if she is saying 'help me god'  They did do a CT scan this morning. See results review.   Patient's sister was called to confirm no previous history of aphasia. Sister is not aware of a previous stroke and not history of expressive aphasia. Updated sister Izora Gala, on [place] for the evening. Answered all questions and concerns that she had. Encouraged Izora Gala to call any time she had any questions or concerns or just wanted an update.   Please see flowsheets for neurological assessment. Will continue to monitor patient.

## 2019-09-23 NOTE — Evaluation (Addendum)
Clinical/Bedside Swallow Evaluation Patient Details  Name: Christie Harper MRN: [id number] Date of Birth: 01-22-1941  Today's Date: 09/23/2019 Time: SLP Start Time (ACUTE ONLY): 1320 SLP Stop Time (ACUTE ONLY): 1340 SLP Time Calculation (min) (ACUTE ONLY): 20 min  Past Medical History:  Past Medical History:  Diagnosis Date  . Breast cancer [facility])    s/p mastectomy and chemotherapy  . Cardiomyopathy ([place])   . Carotid artery disease ([place])   . Former smoker    quit 2008  . Hypertension   . Peripheral arterial disease ([facility])    HPI:  78 year old femalewith a history of HTN, severe PAD (Pletal) s/p bilateral iliac and femoral stenting, carotid stenosis (complete L occlusion, 50% R), unprovoked DVT/PE (Xarelto indefinitely), CKD III, h/o BRCA s/p mastectomy and chemo, former tobacco use (quit 2008).  initially admitted to [facility] on November 21 in the setting of mild hypoxemia and diarrhea.  Covid test became positive on November 22.  Treated with Decadron for 10 days, remdesivir 5 days.  Hypoxemia progressed.  Intubated on November 30-12/4.   Assessment / Plan / Recommendation Clinical Impression  Pt demonstrates no signs of aspiration with large volume consecutive swallows. She also passed [facility] swallow screen with RN, but given mentation RN preferred SLP assessment as well prior to initiating diet. Pt did have some difficulty with cognition in areas of attention and awareness as well as some UE discoordination with self feeding. WIth min assist and cuieng pt able to feed herself without impaired mastication. Recommend pt initiate a mechanical soft diet and thin liquids. Will follow for tolerance and cognitive function.  SLP Visit Diagnosis: Cognitive communication deficit (R41.841)    Aspiration Risk  Mild aspiration risk    Diet Recommendation Dysphagia 3 (Mech soft);Thin liquid   Liquid Administration via: Cup;Straw Medication Administration: Whole meds with  puree Supervision: Staff to assist with self feeding Compensations: Slow rate;Small sips/bites;Minimize environmental distractions Postural Changes: Seated upright at 90 degrees    Other  Recommendations Oral Care Recommendations: Oral care BID   Follow up Recommendations Inpatient Rehab      Frequency and Duration min 2x/week  2 weeks       Prognosis Prognosis for Safe Diet Advancement: Good      Swallow Study   General HPI: 78 year old femalewith a history of HTN, severe PAD (Pletal) s/p bilateral iliac and femoral stenting, carotid stenosis (complete L occlusion, 50% R), unprovoked DVT/PE (Xarelto indefinitely), CKD III, h/o BRCA s/p mastectomy and chemo, former tobacco use (quit 2008).  initially admitted to [facility] on November 21 in the setting of mild hypoxemia and diarrhea.  Covid test became positive on November 22.  Treated with Decadron for 10 days, remdesivir 5 days.  Hypoxemia progressed.  Intubated on November 30-12/4. Type of Study: Bedside Swallow Evaluation Previous Swallow Assessment: none Diet Prior to this Study: NPO Temperature Spikes Noted: No Respiratory Status: Nasal cannula History of Recent Intubation: Yes Length of Intubations (days): 5 days Date extubated: 09/22/19 Behavior/Cognition: Alert;Requires cueing;Distractible Oral Cavity Assessment: Within Functional Limits Oral Care Completed by SLP: No Oral Cavity - Dentition: Adequate natural dentition Self-Feeding Abilities: Able to feed self;Needs assist Patient Positioning: Upright in chair Baseline Vocal Quality: Normal Volitional Cough: Strong Volitional Swallow: Able to elicit    Oral/Motor/Sensory Function Overall Oral Motor/Sensory Function: Within functional limits   Ice Chips Ice chips: Not tested   Thin Liquid Thin Liquid: Within functional limits    Nectar Thick Nectar Thick Liquid: Not tested   Honey Thick  Honey Thick Liquid: Not tested   Puree Puree: Within functional limits    Solid     Solid: Within functional limits Presentation: Self Fed      Other Harper, Christie Ponto 09/23/2019,3:46 PM

## 2019-09-23 NOTE — Progress Notes (Signed)
[place] for Lovenox Indication: PTA Xarelto for hx DVT  Not on File  Patient Measurements: Height: 5\' 7"  (170.2 cm) Weight: 157 lb 3 oz (71.3 kg) IBW/kg (Calculated) : 61.6  Vital Signs: Temp: 99.1 F (37.3 C) (12/05 1000) Temp Source: Bladder (12/05 1000) BP: 97/76 (12/05 1000) Pulse Rate: 105 (12/05 1000)  Labs: Recent Labs    09/20/19 1415 09/20/19 1615  09/21/19 0400 09/22/19 0345 09/22/19 1032 09/23/19 0435  HGB 12.9  --    < > 13.1 12.7 12.2 13.8  HCT 40.4  --    < > 41.5 40.7 36.0 43.2  PLT 196  --   --  197 201  --  230  CREATININE 1.33*  --   --  1.14* 1.29*  --  1.30*  TROPONINIHS 12 16  --   --   --   --   --    < > = values in this interval not displayed.    Estimated Creatinine Clearance: 33.6 mL/min (A) (by C-G formula based on SCr of 1.3 mg/dL (H)).  Medications:  Scheduled:  . aspirin  81 mg Oral Once  . Chlorhexidine Gluconate Cloth  6 each Topical Daily  . cilostazol  50 mg Oral BID  . enoxaparin (LOVENOX) injection  70 mg Subcutaneous Q12H  . famotidine  20 mg Per Tube Daily  . insulin aspart  0-9 Units Subcutaneous Q4H  . mouth rinse  15 mL Mouth Rinse BID  . metoprolol tartrate  25 mg Per Tube BID  . potassium chloride  40 mEq Per Tube BID  . simvastatin  20 mg Oral QHS  . traZODone  50 mg Oral QHS  . vitamin C  500 mg Per Tube Daily  . zinc sulfate  220 mg Per Tube Daily   Infusions:  . ceFEPime (MAXIPIME) IV 2 g (09/23/19 1008)  . dexmedetomidine (PRECEDEX) IV infusion Stopped (09/20/19 2332)  . feeding supplement (VITAL 1.5 CAL) 1,000 mL (09/20/19 1739)  . phenylephrine (NEO-SYNEPHRINE) Adult infusion Stopped (09/21/19 0348)    Assessment: 78 yoF [facility].  Hear most recent dose of Xarelto at [facility] was on 12/1 at 17:34.  Pharmacy was consulted to transition to Lovenox. SCr increased to 1.3 with CrCl ~ 33 ml/min., D-dimer remains < 5.  CBC  stable, WNL.  Goal of Therapy:  Anti-Xa level 0.6-1 units/ml 4hrs after LMWH dose given Monitor platelets by anticoagulation protocol: Yes   Plan:  Lovenox 1 mg/kg (70 mg) [place] q12h. Pharmacy to f/u renal function, CBC, s/s bleeding.  Gretta Arab PharmD, BCPS Clinical pharmacist phone 7am- 5pm: [phone] 09/23/2019 10:58 AM

## 2019-09-23 NOTE — Evaluation (Signed)
Speech Language Pathology Evaluation Patient Details Name: Christie Harper MRN: [id number] DOB: [birth year] Today's Date: 09/23/2019 Time: 1320-1340 SLP Time Calculation (min) (ACUTE ONLY): 20 min  Problem List:  Patient Active Problem List   Diagnosis Date Noted  . Acute respiratory disease due to COVID-19 virus 09/20/2019  . Peripheral arterial disease ([place])   . Hypertension   . Former smoker   . Carotid artery disease ([place])   . Cardiomyopathy ([place])   . Breast cancer [facility])    Past Medical History:  Past Medical History:  Diagnosis Date  . Breast cancer [facility])    s/p mastectomy and chemotherapy  . Cardiomyopathy ([place])   . Carotid artery disease ([place])   . Former smoker    quit 2008  . Hypertension   . Peripheral arterial disease ([facility])    HPI:  78 year old femalewith a history of HTN, severe PAD (Pletal) s/p bilateral iliac and femoral stenting, carotid stenosis (complete L occlusion, 50% R), unprovoked DVT/PE (Xarelto indefinitely), CKD III, h/o BRCA s/p mastectomy and chemo, former tobacco use (quit 2008).  initially admitted to [facility] on November 21 in the setting of mild hypoxemia and diarrhea.  Covid test became positive on November 22.  Treated with Decadron for 10 days, remdesivir 5 days.  Hypoxemia progressed.  Intubated on November 30-12/4.   Assessment / Plan / Recommendation Clinical Impression  Pt observed to have cognitive deficits upon swallow eval, proceeded to evaluate cognition and language given RN concerns. Pt is able to comprehend simple phrases and there are no overt language deficits. However, verbal output and reponse to questions is inconsistent due to poorly focused and sustained attention. Pt does not often intiaite expression of wants and needs and needed immediate repetition, eye contact and verbal cueing to make accurate responses. Memory and awareness swill continue to be impaired until attention improves. She could not follow complex commands  due to these deficits as well. When motivited by PO intake pt was able to sustain attention to self feeding and do some simple problem solving. Will continue to follow for opportunities to advance cognition function.     SLP Assessment  SLP Recommendation/Assessment: Patient needs continued Speech Lanaguage Pathology Services SLP Visit Diagnosis: Cognitive communication deficit (R41.841)    Follow Up Recommendations  Inpatient Rehab    Frequency and Duration min 2x/week  2 weeks      SLP Evaluation Cognition  Overall Cognitive Status: Impaired/Different from baseline Arousal/Alertness: Awake/alert Orientation Level: Oriented to person;Disoriented to place;Disoriented to time;Disoriented to situation Attention: Focused;Sustained Focused Attention: Impaired Focused Attention Impairment: Verbal basic;Functional basic Sustained Attention: Impaired Sustained Attention Impairment: Verbal basic;Functional basic Memory: Impaired Memory Impairment: Storage deficit;Retrieval deficit Awareness: Impaired Awareness Impairment: Intellectual impairment;Emergent impairment;Anticipatory impairment Problem Solving: Impaired Problem Solving Impairment: Verbal basic;Functional basic Executive Function: Initiating Initiating: Impaired Initiating Impairment: Verbal basic;Functional basic Safety/Judgment: Impaired       Comprehension  Auditory Comprehension Overall Auditory Comprehension: Impaired Yes/No Questions: Impaired Basic Biographical Questions: 26-50% accurate Commands: Impaired One Step Basic Commands: 50-74% accurate Two Step Basic Commands: 0-24% accurate Interfering Components: Attention EffectiveTechniques: Visual/Gestural cues Reading Comprehension Reading Status: Not tested    Expression Verbal Expression Overall Verbal Expression: Impaired Initiation: Impaired Automatic Speech: Name;Social Response;Counting Level of Generative/Spontaneous Verbalization:  Word;Phrase Repetition: No impairment Naming: No impairment Interfering Components: Attention   Oral / Motor  Oral Motor/Sensory Function Overall Oral Motor/Sensory Function: Within functional limits Motor Speech Overall Motor Speech: Appears within functional limits for tasks assessed   GO  Herbie Baltimore, MA CCC-SLP  Acute Rehabilitation Services Pager [phone] Office [phone]  Lynann Beaver 09/23/2019, 3:47 PM

## 2019-09-23 NOTE — Progress Notes (Signed)
Facilitated facetime with sister, Izora Gala, and updated her to pt's progress, expected transfer to PCU this evening.  Worth noting: pt did not engage with her sister during the facetime. Pt was conversational with me prior to and immediately after the call, so it is unclear if she was unwilling to speak to [place], if she was hesitant to interact r/t emotion, or some other factor.

## 2019-09-23 NOTE — Progress Notes (Signed)
Neurological assessment completed. See flowsheets.   Previous charting documents patient with aphasia throughout the day. They did a CT scan this morning.   Expressive aphasia present. NIHSS 4. Patient with no other deficits. BUE and BLE with purposeful movements and following commands. Patient able to stick tongue out on command.   Discussed with Opyd, MD. Will continue to closely monitor patient.

## 2019-09-23 NOTE — Progress Notes (Signed)
PROGRESS NOTE                                                                                                                                                                                                             Patient Demographics:    Christie Harper, is a 78 y.o. female, DOB - 28-Jul-1941, JSE:[id number]  Admit date - 09/20/2019   Admitting Physician Juanito Doom, MD  Outpatient Primary MD for the patient is No primary care provider on file.  LOS - 3   No chief complaint on file.      Brief Narrative    78 year old femalewith a history of HTN, severe PAD (Pletal) s/p bilateral iliac and femoral stenting, carotid stenosis (complete L occlusion, 50% R), unprovoked DVT/PE (Xarelto indefinitely), CKD III, h/o BRCA s/pmastectomy and chemo, former tobacco use (quit 2008).  initially admitted to [facility] on November 21 in the setting of mild hypoxemia and diarrhea.  Covid test became positive on November 22.  Treated with Decadron for 10 days, remdesivir 5 days.  Hypoxemia progressed.  Intubated on November 30.  Transferred to [facility] for further management by pulmonary critical care on December  11/21 admit [facility] diarrhea, mild hypoxemia 11/22 COVID test positive, started remdesivir and decadron 11/24 4-8 L O2 11/30 started heated high flow O2, new fever, intubated 12/2 transfer to [facility] 12/4 extubated   Subjective:    [name] was extubated yesterday, with no significant events overnight as discussed with staff.  Assessment  & Plan :    Active Problems:   COVID-19   Peripheral arterial disease ([place])   Hypertension   Former smoker   Carotid artery disease ([place])   Cardiomyopathy ([place])   Breast cancer ([place])  Acute hypoxic respiratory failure/ARDS due to COVID-19 pneumonia -Patient required intubation at OSH, transferred to [facility] for [facility] management. -Vent management  per PCCM, on ARDS protocol, sedation per PCCM as well.  Patient was successfully extubated yesterday, she remains on 4 L heated high flow nasal cannula. -On as needed diuresis, will give extra 40 mg of IV Lasix today despite her hypernatremia, as she appears to be volume overloaded. -On cefepime, to finish total of 5 days. -She finished remdesivir treatment -Continue with steroids. -she is late in her disease to consider Actemra or plasma. -Patient presents  with fever, on cefepime to finish total of 5 days -continue With diuresis, as needed, monitor creatinine and sodium closely. -CRP initially trending down, but currently increasing again, it is at 19.4 today, will continue to monitor closely, procalcitonin remains low at 0.13.   COVID-19 Labs  Recent Labs    09/21/19 0400 09/22/19 0345 09/23/19 0435  DDIMER 2.43* 2.25* 2.42*  FERRITIN 535* 536* 746*  CRP 6.4* 18.4* 19.4*   Sinus arrhythmias -At one point it was thought patient is having A. fib, on amiodarone drip, but reviewing records telemetry strips and EKGs, does not appear to be A. Fib, it appears to be sinus tachycardia with multiple PVCs, amiodarone drip has been stopped.  Chronic systolic CHF -EF 51% with apical akinesis, she is already on full dose Lovenox for known history of PE/DVT. -Per outpatient cardiologist note, most recent cardiac cath 2012, significant for nonobstructive 60% mid LAD. -On as needed IV Lasix, monitor volume status closely.  History of CVA -This morning patient speech is unclear, unclear related to her intubation or acute ischemic event especially she is known to be a vasculopath with carotid stenosis, CT head with no evidence of acute events, but with evidence of old infarcts. -Have SLP to evaluate. -Was given 1 dose aspirin, she is already on Pletal ,statin and Lovenox.  Hyperlipidemia -Continue with statin  History of PE/DVT -Unprovoked, continue with anticoagulation, on Xarelto as  outpatient, currently on Lovenox during hospital stay.  History of CAD -Please see above discussion, 2D echo with apical akinesis,  continue with statin, and beta-blockers.  History of severe PVD/carotid artery disease -Status post multiple stenting, continue with Pletal.  Goals of care:  -Cussed with her sister, patient has no children, she is her next of kin, and discussed goals of care, patient sister would like to continue with full medical management currently, including vent support, and pressors as needed, but agreeable to patient no CPR, no CODE BLUE, but if patient need to be reintubated should be performed.      Code Status : Patient is partial code, no CPR, no CODE BLUE, but she can be reintubated electively if indicated or does not do well after extubation.  Family Communication  : will update her sister by phone today  Disposition Plan  : Remains in ICU  Consults  :  PCCM  Procedures  :  None  DVT Prophylaxis  :  Lovenox  Lab Results  Component Value Date   PLT 230 09/23/2019    Antibiotics  :    Anti-infectives (From admission, onward)   Start     Dose/Rate Route Frequency Ordered Stop   09/21/19 1100  ceFEPIme (MAXIPIME) 2 g in sodium chloride 0.9 % 100 mL IVPB     2 g 200 mL/hr over 30 Minutes Intravenous Every 12 hours 09/21/19 1022 09/25/19 2359        Objective:   Vitals:   09/23/19 1200 09/23/19 1234 09/23/19 1300 09/23/19 1400  BP: (!) 145/101 (!) 161/97 (!) 167/114 (!) 170/91  Pulse: (!) 117 (!) 125 (!) 113 (!) 108  Resp: (!) '21  20 20  ' Temp:      TempSrc:      SpO2: 93% 93% 91% 90%  Weight:      Height:        Wt Readings from Last 3 Encounters:  09/23/19 71.3 kg     Intake/Output Summary (Last 24 hours) at 09/23/2019 1453 Last data filed at 09/23/2019 1400 Gross per 24  hour  Intake 1572.59 ml  Output 2340 ml  Net -767.41 ml     Physical Exam  Awake Alert, still easily distracted, mildly confused, having difficulty producing  words, but this is inconsistent. symmetrical Chest wall movement, diminished air entry at the bases, some crackles, but no wheezing RRR,No Gallops,Rubs or new Murmurs, No Parasternal Heave +ve B.Sounds, Abd Soft, No tenderness, No rebound - guarding or rigidity. No Cyanosis, Clubbing or edema, No new Rash or bruise      Data Review:    CBC Recent Labs  Lab 09/20/19 1415 09/20/19 1619 09/21/19 0400 09/22/19 0345 09/22/19 1032 09/23/19 0435  WBC 8.5  --  8.3 10.5  --  10.4  HGB 12.9 13.3 13.1 12.7 12.2 13.8  HCT 40.4 39.0 41.5 40.7 36.0 43.2  PLT 196  --  197 201  --  230  MCV 94.2  --  95.8 96.0  --  94.5  MCH 30.1  --  30.3 30.0  --  30.2  MCHC 31.9  --  31.6 31.2  --  31.9  RDW 13.9  --  14.1 14.2  --  14.0  LYMPHSABS 0.3*  --  0.3* 0.3*  --  0.8  MONOABS 0.7  --  0.6 0.6  --  0.7  EOSABS 0.0  --  0.0 0.0  --  0.0  BASOSABS 0.0  --  0.0 0.0  --  0.0    Chemistries  Recent Labs  Lab 09/20/19 1415 09/20/19 1619 09/21/19 0400 09/21/19 1630 09/22/19 0345 09/22/19 1032 09/23/19 0435  NA 137 133* 138  --  141 141 148*  K 5.4* 4.2 3.6  --  3.7 3.7 4.3  CL 98  --  99  --  97*  --  96*  CO2 30  --  29  --  33*  --  37*  GLUCOSE 87  --  124*  --  180*  --  118*  BUN 62*  --  65*  --  67*  --  70*  CREATININE 1.33*  --  1.14*  --  1.29*  --  1.30*  CALCIUM 8.6*  --  8.6*  --  9.0  --  9.6  MG 2.5*  --  2.1 1.9 2.0  --  2.2  AST 20  --  16  --  21  --  27  ALT 14  --  15  --  18  --  24  ALKPHOS 42  --  48  --  50  --  58  BILITOT 0.8  --  0.7  --  0.8  --  0.6   ------------------------------------------------------------------------------------------------------------------ No results for input(s): CHOL, HDL, LDLCALC, TRIG, CHOLHDL, LDLDIRECT in the last 72 hours.  Lab Results  Component Value Date   HGBA1C 6.1 (H) 09/20/2019   ------------------------------------------------------------------------------------------------------------------ No results for  input(s): TSH, T4TOTAL, T3FREE, THYROIDAB in the last 72 hours.  Invalid input(s): FREET3 ------------------------------------------------------------------------------------------------------------------ Recent Labs    09/22/19 0345 09/23/19 0435  FERRITIN 536* 746*    Coagulation profile No results for input(s): INR, PROTIME in the last 168 hours.  Recent Labs    09/22/19 0345 09/23/19 0435  DDIMER 2.25* 2.42*    Cardiac Enzymes No results for input(s): CKMB, TROPONINI, MYOGLOBIN in the last 168 hours.  Invalid input(s): CK ------------------------------------------------------------------------------------------------------------------    Component Value Date/Time   BNP 29.1 09/20/2019 1415    Inpatient Medications  Scheduled Meds: . Chlorhexidine Gluconate Cloth  6 each Topical Daily  .  cilostazol  50 mg Oral BID  . enoxaparin (LOVENOX) injection  70 mg Subcutaneous Q12H  . insulin aspart  0-9 Units Subcutaneous Q4H  . mouth rinse  15 mL Mouth Rinse BID  . metoprolol tartrate  25 mg Per Tube BID  . potassium chloride  40 mEq Per Tube BID  . senna-docusate  3 tablet Oral BID  . simvastatin  20 mg Oral QHS  . traZODone  50 mg Oral QHS  . vitamin C  500 mg Per Tube Daily  . zinc sulfate  220 mg Per Tube Daily   Continuous Infusions: . ceFEPime (MAXIPIME) IV 2 g (09/23/19 1008)  . dextrose    . feeding supplement (VITAL 1.5 CAL) 1,000 mL (09/20/19 1739)  . phenylephrine (NEO-SYNEPHRINE) Adult infusion Stopped (09/21/19 0348)   PRN Meds:.ALPRAZolam, metoprolol tartrate, ondansetron (ZOFRAN) IV  Micro Results Recent Results (from the past 240 hour(s))  Culture, blood (routine x 2)     Status: None (Preliminary result)   Collection Time: 09/21/19  8:40 AM   Specimen: BLOOD  Result Value Ref Range Status   Specimen Description   Final    BLOOD RIGHT HAND Performed at [facility] [address], [place], [place] [address]    Special  Requests   Final    BOTTLES DRAWN AEROBIC AND ANAEROBIC Blood Culture adequate volume Performed at [facility] [address] [place], [place] [address]    Culture   Final    NO GROWTH 2 DAYS Performed at [facility] [address] [place], [place] [address]    Report Status PENDING  Incomplete  Culture, blood (routine x 2)     Status: None (Preliminary result)   Collection Time: 09/21/19  8:50 AM   Specimen: BLOOD  Result Value Ref Range Status   Specimen Description   Final    BLOOD RIGHT HAND Performed at [facility] [address] [place], [place] [address]    Special Requests   Final    BOTTLES DRAWN AEROBIC ONLY Blood Culture results may not be optimal due to an inadequate volume of blood received in culture bottles   Culture   Final    NO GROWTH 2 DAYS Performed at [facility], [place] [address], [place], [place] [address]    Report Status PENDING  Incomplete  MRSA PCR Screening     Status: None   Collection Time: 09/22/19  1:35 AM   Specimen: Nasopharyngeal  Result Value Ref Range Status   MRSA by PCR NEGATIVE NEGATIVE Final    Comment:        The GeneXpert MRSA Assay (FDA approved for NASAL specimens only), is one component of a comprehensive MRSA colonization surveillance program. It is not intended to diagnose MRSA infection nor to guide or monitor treatment for MRSA infections. Performed at [facility], [place] [address] [place], [place] [address]     Radiology Reports Ct Head Wo Contrast  Result Date: 09/23/2019 CLINICAL DATA:  Acute speech disturbance.  Coronavirus infection. EXAM: CT HEAD WITHOUT CONTRAST TECHNIQUE: Contiguous axial images were obtained from the base of the skull through the vertex without intravenous contrast. COMPARISON:  None. FINDINGS: Brain: Age related atrophy. Chronic small-vessel ischemic change of the pons. No focal cerebellar insult. Cerebral hemispheres  show old infarctions in the basal ganglia and chronic appearing small vessel ischemic change of the hemispheric white matter. No sign of acute infarction, mass lesion, hemorrhage, hydrocephalus  or extra-axial collection. Vascular: There is atherosclerotic calcification of the major vessels at the base of the brain. Skull: Negative Sinuses/Orbits: Clear/normal Other: None IMPRESSION: No acute finding by CT. 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 No sign of acute insult at this time. Electronically Signed   By: Nelson Chimes M.D.   On: 09/23/2019 09:43   Dg Chest Port 1 View  Result Date: 09/23/2019 CLINICAL DATA:  Hypoxia EXAM: PORTABLE CHEST - 1 VIEW COMPARISON:  09/20/2019 FINDINGS: Patient has been extubated and the nasogastric tube removed. Right arm PICC stable. Persistent interstitial opacities throughout both mid lung fields. Slight worsening of patchy airspace opacities in both lung bases. Heart size stable.  Aortic Atherosclerosis (ICD10-170.0). No definite effusion. No pneumothorax. Visualized bones unremarkable. IMPRESSION: 1. Slight worsening of patchy airspace opacities in both lung bases. 2. Stable interstitial opacities throughout both lungs. 3. Stable support apparatus. 4. Aortic Atherosclerosis (ICD10-170.0). Electronically Signed   By: Lucrezia Europe M.D.   On: 09/23/2019 14:25   Portable Chest 1 View  Result Date: 09/20/2019 CLINICAL DATA:  Shortness of breath EXAM: PORTABLE CHEST 1 VIEW COMPARISON:  None. FINDINGS: Patchy areas of interstitial airspace disease throughout the right lung and to lesser extent left mid lung. No pleural effusion or pneumothorax. Stable cardiomediastinal silhouette. No aggressive osseous lesion. Endotracheal tube with the tip 6 cm above the carina. Right-sided PICC line with the tip projecting over the SVC. Nasogastric tube coursing below the diaphragm. No acute osseous abnormality. IMPRESSION: 1.  Patchy areas of interstitial airspace disease throughout the right lung and to lesser extent left mid lung concerning for atypical viral pneumonia. Electronically Signed   By: Kathreen Devoid   On: 09/20/2019 13:45      Teyona Nichelson M.D on 09/23/2019 at 2:53 PM  Between 7am to 7pm - Pager - [phone]  After 7pm go to [email or web address] - password [facility]  [facility] -  Office  [phone]

## 2019-09-23 NOTE — Evaluation (Addendum)
Occupational Therapy Evaluation Patient Details Name: Christie Harper MRN: [id number] DOB: May 07, 1941 Today's Date: 09/23/2019    History of Present Illness 78 year old femalewith a history of HTN, severe PAD (Pletal) s/p bilateral iliac and femoral stenting, carotid stenosis (complete L occlusion, 50% R), unprovoked DVT/PE (Xarelto indefinitely), CKD III, h/o BRCA s/p mastectomy and chemo, former tobacco use (quit 2008).  initially admitted to [facility] on November 21 in the setting of mild hypoxemia and diarrhea.  Covid test became positive on November 22.  Treated with Decadron for 10 days, remdesivir 5 days.  Hypoxemia progressed.  Intubated on November 30-12/4.  CT of head negative for acute findings.    Clinical Impression   Due to current cognitive status and confusion, pt unable to provide home information and PLOF; pt reporting she lived alone and was independent. Pt currently requires Mod A for UB ADLs, Max A for LB ADLs, and Mod A for functional transfers. Pt presenting with poor strength, balance, cognition, and activity tolerance impacting her safe performance of ADLs. Pt will require further acute OT to optimize occupational performance and participation as well as facilitate safe dc. Recommend dc to CIR for intensive OT to optimize safety, independence with ADLs, and return to PLOF.       Follow Up Recommendations  CIR;Supervision/Assistance - 24 hour    Equipment Recommendations  Other (comment)(Defer to next venue)    Recommendations for Other Services PT consult     Precautions / Restrictions Precautions Precautions: Fall Restrictions Weight Bearing Restrictions: No      Mobility Bed Mobility Overal bed mobility: Needs Assistance Bed Mobility: Supine to Sit     Supine to sit: Mod assist;HOB elevated     General bed mobility comments: Pt able to bring BLEs towards EOB with tactile cues. Pt then grabbing OT's hand to pull into sitting with Mod A.    Transfers Overall transfer level: Needs assistance Equipment used: Straight cane Transfers: Sit to/from [facility] Sit to Stand: Mod assist Stand pivot transfers: Mod assist       General transfer comment: Mod A for power up into standing and then to maitnain balance and pivot to recliner    Balance Overall balance assessment: Needs assistance Sitting-balance support: No upper extremity supported;Feet supported Sitting balance-Leahy Scale: Fair     Standing balance support: Single extremity supported;During functional activity Standing balance-Leahy Scale: Poor Standing balance comment: Reliant on physical A                           ADL either performed or assessed with clinical judgement   ADL Overall ADL's : Needs assistance/impaired Eating/Feeding: NPO;Sitting   Grooming: Minimal assistance;Sitting   Upper Body Bathing: Moderate assistance;Sitting   Lower Body Bathing: Maximal assistance;Sit to/from stand   Upper Body Dressing : Moderate assistance;Sitting   Lower Body Dressing: Maximal assistance;Sit to/from stand   Toilet Transfer: Moderate assistance;Stand-pivot(simulated to recliner) Toilet Transfer Details (indicate cue type and reason): Pt requiring Mod A to power up into standing and then maintain balance while pivoting towards recliner. Pt with poor following of cues and difficulty sequencing steps of RLE forward for pivot         Functional mobility during ADLs: Moderate assistance(stand pivot) General ADL Comments: Pt with poor cognition, activity tolerance, strength, and balance impacting her performance of ADLs     Vision         Perception     Praxis  Pertinent Vitals/Pain Pain Assessment: Faces Faces Pain Scale: Hurts a little bit Pain Location: Generalized Pain Descriptors / Indicators: Discomfort Pain Intervention(s): Monitored during session;Repositioned     Hand Dominance     Extremity/Trunk  Assessment Upper Extremity Assessment Upper Extremity Assessment: Generalized weakness   Lower Extremity Assessment Lower Extremity Assessment: Defer to PT evaluation   Cervical / Trunk Assessment Cervical / Trunk Assessment: Kyphotic   Communication Communication Communication: Expressive difficulties;Other (comment)(Decreased attention)   Cognition Arousal/Alertness: Awake/alert Behavior During Therapy: WFL for tasks assessed/performed Overall Cognitive Status: Impaired/Different from baseline Area of Impairment: Orientation;Attention;Memory;Safety/judgement;Following commands;Awareness;Problem solving                 Orientation Level: Disoriented to;Place;Time;Situation Current Attention Level: Focused Memory: Decreased short-term memory Following Commands: Follows one step commands inconsistently;Follows one step commands with increased time Safety/Judgement: Decreased awareness of safety;Decreased awareness of deficits Awareness: Intellectual Problem Solving: Slow processing;Decreased initiation;Difficulty sequencing;Requires tactile cues;Requires verbal cues General Comments: Pt presenting with poor attention limiting her ability to participate in conversation and answer home set up/PLOF. Pt answering 50% on simple yes/no questions   General Comments  SpO2 93% on 5L. Dropped to 80s with bed mobility. Elevated to 10L for mobility and she maintain 88-85%. Once rested in recliner, able to return to 5L.     Exercises     Shoulder Instructions      Home Living Family/patient expects to be discharged to:: Private residence Living Arrangements: Alone   Type of Home: House                           Additional Comments: Pt reporting she lives alone prior to admission and was independent. Due to current cognition level, unsure of reliability      Prior Functioning/Environment          Comments: Pt reports she was independent        OT Problem List:  Decreased strength;Decreased range of motion;Decreased activity tolerance;Impaired balance (sitting and/or standing);Decreased knowledge of use of DME or AE;Decreased knowledge of precautions      OT Treatment/Interventions: Self-care/ADL training;Therapeutic exercise;Energy conservation;DME and/or AE instruction;Therapeutic activities;Patient/family education    OT Goals(Current goals can be found in the care plan section) Acute Rehab OT Goals Patient Stated Goal: Unstated OT Goal Formulation: With patient Time For Goal Achievement: 10/07/19 Potential to Achieve Goals: Good  OT Frequency: Min 2X/week   Barriers to D/C:            Co-evaluation              AM-PAC OT "6 Clicks" Daily Activity     Outcome Measure Help from another person eating meals?: Total Help from another person taking care of personal grooming?: A Little Help from another person toileting, which includes using toliet, bedpan, or urinal?: A Lot Help from another person bathing (including washing, rinsing, drying)?: A Lot Help from another person to put on and taking off regular upper body clothing?: A Little Help from another person to put on and taking off regular lower body clothing?: A Lot 6 Click Score: 13   End of Session Equipment Utilized During Treatment: Oxygen Nurse Communication: Mobility status  Activity Tolerance: Patient tolerated treatment well Patient left: in chair;with call bell/phone within reach  OT Visit Diagnosis: Unsteadiness on feet (R26.81);Other abnormalities of gait and mobility (R26.89);Muscle weakness (generalized) (M62.81)                Time: 2706-2376  OT Time Calculation (min): 20 min Charges:  OT General Charges $OT Visit: 1 Visit OT Evaluation $OT Eval Moderate Complexity: [place], OTR/L Acute Rehab Pager: [phone] Office: [place] 09/23/2019, 5:01 PM

## 2019-09-24 DIAGNOSIS — R4701 Aphasia: Secondary | ICD-10-CM

## 2019-09-24 LAB — CBC WITH DIFFERENTIAL/PLATELET
Abs Immature Granulocytes: 0.06 10*3/uL (ref 0.00–0.07)
Basophils Absolute: 0 10*3/uL (ref 0.0–0.1)
Basophils Relative: 0 %
Eosinophils Absolute: 0 10*3/uL (ref 0.0–0.5)
Eosinophils Relative: 0 %
HCT: 41.6 % (ref 36.0–46.0)
Hemoglobin: 13.3 g/dL (ref 12.0–15.0)
Immature Granulocytes: 1 %
Lymphocytes Relative: 3 %
Lymphs Abs: 0.4 10*3/uL — ABNORMAL LOW (ref 0.7–4.0)
MCH: 30.1 pg (ref 26.0–34.0)
MCHC: 32 g/dL (ref 30.0–36.0)
MCV: 94.1 fL (ref 80.0–100.0)
Monocytes Absolute: 0.7 10*3/uL (ref 0.1–1.0)
Monocytes Relative: 6 %
Neutro Abs: 11.5 10*3/uL — ABNORMAL HIGH (ref 1.7–7.7)
Neutrophils Relative %: 90 %
Platelets: 263 10*3/uL (ref 150–400)
RBC: 4.42 MIL/uL (ref 3.87–5.11)
RDW: 14.1 % (ref 11.5–15.5)
WBC: 12.7 10*3/uL — ABNORMAL HIGH (ref 4.0–10.5)
nRBC: 0 % (ref 0.0–0.2)

## 2019-09-24 LAB — COMPREHENSIVE METABOLIC PANEL
ALT: 25 U/L (ref 0–44)
AST: 30 U/L (ref 15–41)
Albumin: 2.6 g/dL — ABNORMAL LOW (ref 3.5–5.0)
Alkaline Phosphatase: 57 U/L (ref 38–126)
Anion gap: 15 (ref 5–15)
BUN: 67 mg/dL — ABNORMAL HIGH (ref 8–23)
CO2: 34 mmol/L — ABNORMAL HIGH (ref 22–32)
Calcium: 9.8 mg/dL (ref 8.9–10.3)
Chloride: 96 mmol/L — ABNORMAL LOW (ref 98–111)
Creatinine, Ser: 1.31 mg/dL — ABNORMAL HIGH (ref 0.44–1.00)
GFR calc Af Amer: 44 mL/min — ABNORMAL LOW (ref >60)
GFR calc non Af Amer: 38 mL/min — ABNORMAL LOW (ref >60)
Glucose, Bld: 136 mg/dL — ABNORMAL HIGH (ref 70–99)
Potassium: 3.6 mmol/L (ref 3.5–5.1)
Sodium: 145 mmol/L (ref 135–145)
Total Bilirubin: 1.1 mg/dL (ref 0.3–1.2)
Total Protein: 6.8 g/dL (ref 6.5–8.1)

## 2019-09-24 LAB — D-DIMER, QUANTITATIVE: D-Dimer, Quant: 2.31 ug/mL-FEU — ABNORMAL HIGH (ref 0.00–0.50)

## 2019-09-24 LAB — GLUCOSE, CAPILLARY
Glucose-Capillary: 115 mg/dL — ABNORMAL HIGH (ref 70–99)
Glucose-Capillary: 122 mg/dL — ABNORMAL HIGH (ref 70–99)
Glucose-Capillary: 122 mg/dL — ABNORMAL HIGH (ref 70–99)
Glucose-Capillary: 123 mg/dL — ABNORMAL HIGH (ref 70–99)
Glucose-Capillary: 129 mg/dL — ABNORMAL HIGH (ref 70–99)
Glucose-Capillary: 147 mg/dL — ABNORMAL HIGH (ref 70–99)
Glucose-Capillary: 179 mg/dL — ABNORMAL HIGH (ref 70–99)

## 2019-09-24 LAB — C-REACTIVE PROTEIN: CRP: 11 mg/dL — ABNORMAL HIGH (ref <1.0)

## 2019-09-24 LAB — FERRITIN: Ferritin: 836 ng/mL — ABNORMAL HIGH (ref 11–307)

## 2019-09-24 LAB — PHOSPHORUS: Phosphorus: 3.8 mg/dL (ref 2.5–4.6)

## 2019-09-24 LAB — MAGNESIUM: Magnesium: 2.2 mg/dL (ref 1.7–2.4)

## 2019-09-24 MED ORDER — ASPIRIN EC 81 MG PO TBEC
81.0000 mg | DELAYED_RELEASE_TABLET | Freq: Every day | ORAL | Status: DC
  Administered 2019-09-25 – 2019-10-07 (×13): 81 mg via ORAL
  Filled 2019-09-24 (×13): qty 1

## 2019-09-24 NOTE — Plan of Care (Signed)

## 2019-09-24 NOTE — Progress Notes (Signed)
Patient sister called and updated on patient status and plan of care. Updated her that we are awaiting a transfer order to move patient out of ICU

## 2019-09-24 NOTE — Plan of Care (Signed)
Received a call from Dr. Waldron Labs regarding this patient, who is currently admitted at: [place] with COVID-19 infection. Concern for intermittent expressive aphasia after extubation.  Following commands.  No gaze preference. Last known normal prior to admission 09/20/2019. Episode of hypotension at that time noted on the chart. Concern for stroke, subacute. CT head with no acute changes reported but on review I question if there is left hemispheric watershed infarcts and some of the chronic white matter changes reported are actually subacute strokes. In any case she is outside the window for any intervention.  My recommendations on phone consultation are below  Recommendations: Obtain an MRI of the brain. Obtain MRA head and neck without contrast given deranged renal function If it turns out to be positive for stroke, please call neurology for formal telemedicine consultation.   -- Amie Portland, MD [facility] Pager: [phone] If 7pm to 7am, please call on call as listed on AMION.

## 2019-09-24 NOTE — Progress Notes (Signed)
PROGRESS NOTE                                                                                                                                                                                                             Patient Demographics:    Christie Harper, is a 78 y.o. female, DOB - 12-30-1940, HYH:[id number]  Admit date - 09/20/2019   Admitting Physician Juanito Doom, MD  Outpatient Primary MD for the patient is No primary care provider on file.  LOS - 4   No chief complaint on file.      Brief Narrative    78 year old femalewith a history of HTN, severe PAD (Pletal) s/p bilateral iliac and femoral stenting, carotid stenosis (complete L occlusion, 50% R), unprovoked DVT/PE (Xarelto indefinitely), CKD III, h/o BRCA s/pmastectomy and chemo, former tobacco use (quit 2008).  initially admitted to [facility] on November 21 in the setting of mild hypoxemia and diarrhea.  Covid test became positive on November 22.  Treated with Decadron for 10 days, remdesivir 5 days.  Hypoxemia progressed.  Intubated on November 30.  Transferred to [facility] for further management by pulmonary critical care on December  11/21 admit [place] hospital diarrhea, mild hypoxemia 11/22 COVID test positive, started remdesivir and decadron 11/24 4-8 L O2 11/30 started heated high flow O2, new fever, intubated 12/2 transfer to [facility] 12/4 extubated   Subjective:    [name] her swallow evaluation yesterday, otherwise no significant events as discussed with staff  Assessment  & Plan :    Active Problems:   Acute respiratory disease due to COVID-19 virus   Peripheral arterial disease ([place])   Hypertension   Former smoker   Carotid artery disease ([place])   Cardiomyopathy ([place])   Breast cancer ([place])  Acute hypoxic respiratory failure/ARDS due to COVID-19 pneumonia -Patient required intubation at OSH, transferred to [facility]  for [facility] management. -Vent management per PCCM, on ARDS protocol, sedation per PCCM as well.  Patient was successfully extubated 12/4, she remains on nasal cannula today. -Lasix as needed -She finished remdesivir treatment -Continue with steroids. -she is late in her disease to consider Actemra or plasma. -Patient presents with fever, on cefepime to finished total of 5 days -Continue to monitor inflammatory markers closely.   [place]  09/22/19 0345 09/23/19 0435 09/24/19 0400  DDIMER 2.25* 2.42* 2.31*  FERRITIN 536* 746* 836*  CRP 18.4* 19.4* 11.0*   Slurred speech/facial droop -Able to examine patient much better today given her mentation has improved, appears to be having some facial droop, and expressive aphasia, concern for stroke, likely left hemispheric watershed infarct given known history of total left ICA occlusion, and episode of hypotension. -Is already on full dose Lovenox, Pletal, will add baby aspirin. -Discussed with neurology, recommendation for MRI brain, MRA head and neck without contrast.   Sinus arrhythmias -At one point it was thought patient is having A. fib, on amiodarone drip, but reviewing records telemetry strips and EKGs, does not appear to be A. Fib, it appears to be sinus tachycardia with multiple PVCs, amiodarone drip has been stopped.  Chronic systolic CHF -EF 02% with apical akinesis, she is already on full dose Lovenox for known history of PE/DVT. -Per outpatient cardiologist note, most recent cardiac cath 2012, significant for nonobstructive 60% mid LAD. -On as needed IV Lasix, monitor volume status closely.  Hyperlipidemia -Continue with statin  History of PE/DVT -Unprovoked, continue with anticoagulation, on Xarelto as outpatient, currently on Lovenox during hospital stay.  History of CAD -Please see above discussion, 2D echo with apical akinesis,  continue with statin, and beta-blockers.  History of severe  PVD/carotid artery disease -Status post multiple stenting, continue with Pletal.  Goals of care:  -Cussed with her sister, patient has no children, she is her next of kin, and discussed goals of care, patient sister would like to continue with full medical management currently, including vent support, and pressors as needed, but agreeable to patient no CPR, no CODE BLUE, but if patient need to be reintubated should be performed.      Code Status : Patient is partial code, no CPR, no CODE BLUE, but she can be reintubated electively if indicated or does not do well after extubation.  Family Communication  : Sister updated daily  Disposition Plan  : Remains in ICU  Consults  :  PCCM  Procedures  :  None  DVT Prophylaxis  :  Lovenox  Lab Results  Component Value Date   PLT 263 09/24/2019    Antibiotics  :    Anti-infectives (From admission, onward)   Start     Dose/Rate Route Frequency Ordered Stop   09/21/19 1100  ceFEPIme (MAXIPIME) 2 g in sodium chloride 0.9 % 100 mL IVPB     2 g 200 mL/hr over 30 Minutes Intravenous Every 12 hours 09/21/19 1022 09/25/19 2359        Objective:   Vitals:   09/24/19 1108 09/24/19 1200 09/24/19 1238 09/24/19 1542  BP: 128/77   (!) 149/78  Pulse: (!) 120  (!) 116 (!) 124  Resp: (!) '23  20 20  ' Temp:  97.6 F (36.4 C)    TempSrc:  Axillary    SpO2: 95% 90% 91% 93%  Weight:      Height:        Wt Readings from Last 3 Encounters:  09/24/19 71.1 kg     Intake/Output Summary (Last 24 hours) at 09/24/2019 1633 Last data filed at 09/24/2019 0000 Gross per 24 hour  Intake 174.02 ml  Output 440 ml  Net -265.98 ml     Physical Exam  Awake Alert, confused, with aphasia and some facial droop Symmetrical Chest wall movement, Good air movement bilaterally, CTAB RRR,No Gallops,Rubs or new Murmurs, No Parasternal Heave +ve B.Sounds,  Abd Soft, No tenderness, No rebound - guarding or rigidity. No Cyanosis, Clubbing or edema, No new Rash  or bruise  , will to move all extremities without gross deficit.     Data Review:    CBC Recent Labs  Lab 09/20/19 1415  09/21/19 0400 09/22/19 0345 09/22/19 1032 09/23/19 0435 09/24/19 0400  WBC 8.5  --  8.3 10.5  --  10.4 12.7*  HGB 12.9   < > 13.1 12.7 12.2 13.8 13.3  HCT 40.4   < > 41.5 40.7 36.0 43.2 41.6  PLT 196  --  197 201  --  230 263  MCV 94.2  --  95.8 96.0  --  94.5 94.1  MCH 30.1  --  30.3 30.0  --  30.2 30.1  MCHC 31.9  --  31.6 31.2  --  31.9 32.0  RDW 13.9  --  14.1 14.2  --  14.0 14.1  LYMPHSABS 0.3*  --  0.3* 0.3*  --  0.8 0.4*  MONOABS 0.7  --  0.6 0.6  --  0.7 0.7  EOSABS 0.0  --  0.0 0.0  --  0.0 0.0  BASOSABS 0.0  --  0.0 0.0  --  0.0 0.0   < > = values in this interval not displayed.    Chemistries  Recent Labs  Lab 09/20/19 1415  09/21/19 0400 09/21/19 1630 09/22/19 0345 09/22/19 1032 09/23/19 0435 09/24/19 0400  NA 137   < > 138  --  141 141 148* 145  K 5.4*   < > 3.6  --  3.7 3.7 4.3 3.6  CL 98  --  99  --  97*  --  96* 96*  CO2 30  --  29  --  33*  --  37* 34*  GLUCOSE 87  --  124*  --  180*  --  118* 136*  BUN 62*  --  65*  --  67*  --  70* 67*  CREATININE 1.33*  --  1.14*  --  1.29*  --  1.30* 1.31*  CALCIUM 8.6*  --  8.6*  --  9.0  --  9.6 9.8  MG 2.5*  --  2.1 1.9 2.0  --  2.2 2.2  AST 20  --  16  --  21  --  27 30  ALT 14  --  15  --  18  --  24 25  ALKPHOS 42  --  48  --  50  --  58 57  BILITOT 0.8  --  0.7  --  0.8  --  0.6 1.1   < > = values in this interval not displayed.   ------------------------------------------------------------------------------------------------------------------ No results for input(s): CHOL, HDL, LDLCALC, TRIG, CHOLHDL, LDLDIRECT in the last 72 hours.  Lab Results  Component Value Date   HGBA1C 6.1 (H) 09/20/2019   ------------------------------------------------------------------------------------------------------------------ No results for input(s): TSH, T4TOTAL, T3FREE, THYROIDAB in the  last 72 hours.  Invalid input(s): FREET3 ------------------------------------------------------------------------------------------------------------------ Recent Labs    09/23/19 0435 09/24/19 0400  FERRITIN 746* 836*    Coagulation profile No results for input(s): INR, PROTIME in the last 168 hours.  Recent Labs    09/23/19 0435 09/24/19 0400  DDIMER 2.42* 2.31*    Cardiac Enzymes No results for input(s): CKMB, TROPONINI, MYOGLOBIN in the last 168 hours.  Invalid input(s): CK ------------------------------------------------------------------------------------------------------------------    Component Value Date/Time   BNP 29.1 09/20/2019 1415    Inpatient Medications  Scheduled Meds: .  Chlorhexidine Gluconate Cloth  6 each Topical Daily  . cilostazol  50 mg Oral BID  . enoxaparin (LOVENOX) injection  70 mg Subcutaneous Q12H  . insulin aspart  0-6 Units Subcutaneous Q4H  . mouth rinse  15 mL Mouth Rinse BID  . metoprolol tartrate  25 mg Per Tube BID  . senna-docusate  3 tablet Oral BID  . simvastatin  20 mg Oral QHS  . traZODone  50 mg Oral QHS  . vitamin C  500 mg Per Tube Daily  . zinc sulfate  220 mg Per Tube Daily   Continuous Infusions: . ceFEPime (MAXIPIME) IV 2 g (09/24/19 0848)  . feeding supplement (VITAL 1.5 CAL) 1,000 mL (09/20/19 1739)  . phenylephrine (NEO-SYNEPHRINE) Adult infusion Stopped (09/21/19 0348)   PRN Meds:.ALPRAZolam, metoprolol tartrate, ondansetron (ZOFRAN) IV  Micro Results Recent Results (from the past 240 hour(s))  Culture, blood (routine x 2)     Status: None (Preliminary result)   Collection Time: 09/21/19  8:40 AM   Specimen: BLOOD  Result Value Ref Range Status   Specimen Description   Final    BLOOD RIGHT HAND Performed at [place] [address] [place], [place] [address]    Special Requests   Final    BOTTLES DRAWN AEROBIC AND ANAEROBIC Blood Culture adequate volume Performed at [place] [address] [place], [place] [address]    Culture   Final    NO GROWTH 3 DAYS Performed at [facility], [place] [address], [place], [place] [address]    Report Status PENDING  Incomplete  Culture, blood (routine x 2)     Status: None (Preliminary result)   Collection Time: 09/21/19  8:50 AM   Specimen: BLOOD  Result Value Ref Range Status   Specimen Description   Final    BLOOD RIGHT HAND Performed at [place] [address] [place], [place] [address]    Special Requests   Final    BOTTLES DRAWN AEROBIC ONLY Blood Culture results may not be optimal due to an inadequate volume of blood received in culture bottles   Culture   Final    NO GROWTH 3 DAYS Performed at [facility], [place] [address], [place], [place] [address]    Report Status PENDING  Incomplete  MRSA PCR Screening     Status: None   Collection Time: 09/22/19  1:35 AM   Specimen: Nasopharyngeal  Result Value Ref Range Status   MRSA by PCR NEGATIVE NEGATIVE Final    Comment:        The GeneXpert MRSA Assay (FDA approved for NASAL specimens only), is one component of a comprehensive MRSA colonization surveillance program. It is not intended to diagnose MRSA infection nor to guide or monitor treatment for MRSA infections. Performed at [facility], [place] [address] [place], [place] [address]     Radiology Reports Ct Head Wo Contrast  Result Date: 09/23/2019 CLINICAL DATA:  Acute speech disturbance.  Coronavirus infection. EXAM: CT HEAD WITHOUT CONTRAST TECHNIQUE: Contiguous axial images were obtained from the base of the skull through the vertex without intravenous contrast. COMPARISON:  None. FINDINGS: Brain: Age related atrophy. Chronic small-vessel ischemic change of the pons. No focal cerebellar insult. Cerebral hemispheres show old infarctions in the basal ganglia and chronic appearing small vessel ischemic change of the  hemispheric white matter. No sign of acute infarction, mass lesion, hemorrhage, hydrocephalus or extra-axial collection. Vascular: There  is atherosclerotic calcification of the major vessels at the base of the brain. Skull: Negative Sinuses/Orbits: Clear/normal Other: None IMPRESSION: No acute finding by CT. 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 No sign of acute insult at this time. Electronically Signed   By: Nelson Chimes M.D.   On: 09/23/2019 09:43   Dg Chest Port 1 View  Result Date: 09/23/2019 CLINICAL DATA:  Hypoxia EXAM: PORTABLE CHEST - 1 VIEW COMPARISON:  09/20/2019 FINDINGS: Patient has been extubated and the nasogastric tube removed. Right arm PICC stable. Persistent interstitial opacities throughout both mid lung fields. Slight worsening of patchy airspace opacities in both lung bases. Heart size stable.  Aortic Atherosclerosis (ICD10-170.0). No definite effusion. No pneumothorax. Visualized bones unremarkable. IMPRESSION: 1. Slight worsening of patchy airspace opacities in both lung bases. 2. Stable interstitial opacities throughout both lungs. 3. Stable support apparatus. 4. Aortic Atherosclerosis (ICD10-170.0). Electronically Signed   By: Lucrezia Europe M.D.   On: 09/23/2019 14:25   Portable Chest 1 View  Result Date: 09/20/2019 CLINICAL DATA:  Shortness of breath EXAM: PORTABLE CHEST 1 VIEW COMPARISON:  None. FINDINGS: Patchy areas of interstitial airspace disease throughout the right lung and to lesser extent left mid lung. No pleural effusion or pneumothorax. Stable cardiomediastinal silhouette. No aggressive osseous lesion. Endotracheal tube with the tip 6 cm above the carina. Right-sided PICC line with the tip projecting over the SVC. Nasogastric tube coursing below the diaphragm. No acute osseous abnormality. IMPRESSION: 1. Patchy areas of interstitial airspace disease throughout the right lung and to lesser extent left mid  lung concerning for atypical viral pneumonia. Electronically Signed   By: Kathreen Devoid   On: 09/20/2019 13:45      Phillips Climes M.D on 09/24/2019 at 4:32 PM  Between 7am to 7pm - Pager - [phone]  After 7pm go to [email or web address] - password [facility]  [facility] -  Office  [phone]

## 2019-09-24 NOTE — Progress Notes (Signed)
Pharmacy Antibiotic Note  Christie Harper is a 78 y.o. female admitted on 09/20/2019 with COVID-19 pneumonia.  Pharmacy has been consulted for Cefepime dosing for suspected bacterial pneumonia d/t increased secretions, fever. Day #4 abx. PCT <0.1, < 0.1, 0.13 SCr remains ~ 1.3 with Crcl ~ 33 ml/min  Plan: Cefepime 2g IV q12h Per MD, complete 5 day course on 12/7.  Height: 5\' 7"  (170.2 cm) Weight: 156 lb 12 oz (71.1 kg) IBW/kg (Calculated) : 61.6  Temp (24hrs), Avg:98.6 F (37 C), Min:98 F (36.7 C), Max:99.1 F (37.3 C)  Recent Labs  Lab 09/20/19 1415 09/21/19 0400 09/22/19 0345 09/23/19 0435 09/24/19 0400  WBC 8.5 8.3 10.5 10.4 12.7*  CREATININE 1.33* 1.14* 1.29* 1.30* 1.31*    Estimated Creatinine Clearance: 33.3 mL/min (A) (by C-G formula based on SCr of 1.31 mg/dL (H)).    Not on File  Antimicrobials this admission: Remdesivir 11/22> 11/26 Ceftriaxone 11/24 > 11/28 Azithro 11/24 > 11/28 Cefepime 12/3 >> (12/7)  Dose adjustments this admission:  Microbiology results: 12/3 BCx: ngtd 12/4 MRSA PCR: neg  Thank you for allowing pharmacy to be a part of this patient's care.  Gretta Arab PharmD, BCPS Clinical pharmacist phone 7am- 5pm: [phone] 09/24/2019 9:50 AM

## 2019-09-25 ENCOUNTER — Ambulatory Visit (HOSPITAL_COMMUNITY)
Admission: AD | Admit: 2019-09-25 | Discharge: 2019-09-25 | Disposition: A | Discharge status: Home / Self Care | Payer: Medicare Other | Source: Other Acute Inpatient Hospital | Attending: Internal Medicine | Admitting: Internal Medicine

## 2019-09-25 LAB — COMPREHENSIVE METABOLIC PANEL
ALT: 22 U/L (ref 0–44)
AST: 24 U/L (ref 15–41)
Albumin: 2.2 g/dL — ABNORMAL LOW (ref 3.5–5.0)
Alkaline Phosphatase: 54 U/L (ref 38–126)
Anion gap: 12 (ref 5–15)
BUN: 64 mg/dL — ABNORMAL HIGH (ref 8–23)
CO2: 33 mmol/L — ABNORMAL HIGH (ref 22–32)
Calcium: 9.4 mg/dL (ref 8.9–10.3)
Chloride: 95 mmol/L — ABNORMAL LOW (ref 98–111)
Creatinine, Ser: 1.19 mg/dL — ABNORMAL HIGH (ref 0.44–1.00)
GFR calc Af Amer: 50 mL/min — ABNORMAL LOW (ref >60)
GFR calc non Af Amer: 43 mL/min — ABNORMAL LOW (ref >60)
Glucose, Bld: 109 mg/dL — ABNORMAL HIGH (ref 70–99)
Potassium: 3.7 mmol/L (ref 3.5–5.1)
Sodium: 140 mmol/L (ref 135–145)
Total Bilirubin: 0.8 mg/dL (ref 0.3–1.2)
Total Protein: 6.2 g/dL — ABNORMAL LOW (ref 6.5–8.1)

## 2019-09-25 LAB — CBC WITH DIFFERENTIAL/PLATELET
Abs Immature Granulocytes: 0.05 10*3/uL (ref 0.00–0.07)
Basophils Absolute: 0 10*3/uL (ref 0.0–0.1)
Basophils Relative: 0 %
Eosinophils Absolute: 0 10*3/uL (ref 0.0–0.5)
Eosinophils Relative: 0 %
HCT: 37.2 % (ref 36.0–46.0)
Hemoglobin: 11.9 g/dL — ABNORMAL LOW (ref 12.0–15.0)
Immature Granulocytes: 1 %
Lymphocytes Relative: 9 %
Lymphs Abs: 0.7 10*3/uL (ref 0.7–4.0)
MCH: 30.1 pg (ref 26.0–34.0)
MCHC: 32 g/dL (ref 30.0–36.0)
MCV: 93.9 fL (ref 80.0–100.0)
Monocytes Absolute: 0.6 10*3/uL (ref 0.1–1.0)
Monocytes Relative: 8 %
Neutro Abs: 6.3 10*3/uL (ref 1.7–7.7)
Neutrophils Relative %: 82 %
Platelets: 232 10*3/uL (ref 150–400)
RBC: 3.96 MIL/uL (ref 3.87–5.11)
RDW: 14.2 % (ref 11.5–15.5)
WBC: 7.7 10*3/uL (ref 4.0–10.5)
nRBC: 0 % (ref 0.0–0.2)

## 2019-09-25 LAB — C-REACTIVE PROTEIN: CRP: 6.9 mg/dL — ABNORMAL HIGH (ref <1.0)

## 2019-09-25 LAB — LIPID PANEL
Cholesterol: 123 mg/dL (ref 0–200)
HDL: 31 mg/dL — ABNORMAL LOW (ref >40)
LDL Cholesterol: 69 mg/dL (ref 0–99)
Total CHOL/HDL Ratio: 4 RATIO
Triglycerides: 114 mg/dL (ref <150)
VLDL: 23 mg/dL (ref 0–40)

## 2019-09-25 LAB — GLUCOSE, CAPILLARY
Glucose-Capillary: 154 mg/dL — ABNORMAL HIGH (ref 70–99)
Glucose-Capillary: 165 mg/dL — ABNORMAL HIGH (ref 70–99)
Glucose-Capillary: 58 mg/dL — ABNORMAL LOW (ref 70–99)
Glucose-Capillary: 59 mg/dL — ABNORMAL LOW (ref 70–99)
Glucose-Capillary: 132 mg/dL — ABNORMAL HIGH (ref 70–99)
Glucose-Capillary: 135 mg/dL — ABNORMAL HIGH (ref 70–99)
Glucose-Capillary: 160 mg/dL — ABNORMAL HIGH (ref 70–99)
Glucose-Capillary: 91 mg/dL (ref 70–99)
Glucose-Capillary: 128 mg/dL — ABNORMAL HIGH (ref 70–99)

## 2019-09-25 LAB — FERRITIN: Ferritin: 898 ng/mL — ABNORMAL HIGH (ref 11–307)

## 2019-09-25 LAB — D-DIMER, QUANTITATIVE: D-Dimer, Quant: 2.08 ug/mL-FEU — ABNORMAL HIGH (ref 0.00–0.50)

## 2019-09-25 MED ORDER — LORAZEPAM 2 MG/ML IJ SOLN
1.0000 mg | Freq: Once | INTRAMUSCULAR | Status: AC | PRN
  Administered 2019-09-25: 1 mg via INTRAVENOUS
  Filled 2019-09-25: qty 1

## 2019-09-25 NOTE — Progress Notes (Signed)
Pt leaving for MRI with EMS transport. Premedicated with 1mg  ativan. Report given to escorting staff

## 2019-09-25 NOTE — Progress Notes (Signed)
Rehab Admissions Coordinator Note:  Per PT recommendation,this patient was screened by Raechel Ache for appropriateness for an Inpatient Acute Rehab Consult.  It is noted that this patient tested positive Nov 22. Per current CIR guidelines, the patient will need to be >20 out from positive test (~12/12) or have two negative tests prior to being considered for IP Rehab admission. AC will continue to follow pt for progress with therapies to determine if consult warrented as it gets closer to 20 days post + test.    Raechel Ache 09/25/2019, 5:14 PM  I can be reached at [phone].

## 2019-09-25 NOTE — Evaluation (Addendum)
Physical Therapy Evaluation Patient Details Name: Christie Harper MRN: [id number] DOB: 09/12/1941 Today's Date: 09/25/2019   History of Present Illness  78 year old femalewith a history of HTN, severe PAD (Pletal) s/p bilateral iliac and femoral stenting, carotid stenosis (complete L occlusion, 50% R), unprovoked DVT/PE (Xarelto indefinitely), CKD III, h/o BRCA s/p mastectomy and chemo, former tobacco use (quit 2008).  initially admitted to [facility] on November 21 in the setting of mild hypoxemia and diarrhea.  Covid test became positive on November 22.  Treated with Decadron for 10 days, remdesivir 5 days.  Hypoxemia progressed.  Intubated on November 30-12/4.  CT of head negative for acute findings. MRI pending for stroke due to aphasia.   Clinical Impression  Pt was writhing around in her bed with HR in the 120s when I entered the room, repeating "help me" over and over again.  She was able to stand and transfer to the [facility] (we figured out she needed to have a BM and wanted water).  Two person assist for the initial transfer, down to one person assist on the way back to bed.  There was no available chair alarm plug ups, so it was not safe to get her to the recliner chair. Pt would benefit from post acute rehab at discharge.  I was unable to get her sister on the phone to discuss or confirm home set up/situation.   PT to follow acutely for deficits listed below.   O2 sats in the upper 80s on 3 L O2 [place] during mobility.  HR 120s-130s.  Measured by nellcor finger probe.   Follow Up Recommendations CIR    Equipment Recommendations  3in1 (PT);Rolling walker with 5" wheels    Recommendations for Other Services Rehab consult     Precautions / Restrictions Precautions Precautions: Fall Precaution Comments: impulsive, unable to express her needs      Mobility  Bed Mobility Overal bed mobility: Needs Assistance Bed Mobility: Supine to Sit;Sit to Supine     Supine to sit: Min  assist Sit to supine: Min assist   General bed mobility comments: Min assist to come to sitting EOB, second person used for line management and safety due to impulsivity.   Transfers Overall transfer level: Needs assistance Equipment used: 2 person hand held assist Transfers: Sit to/from [facility] Sit to Stand: +2 physical assistance;Mod assist Stand pivot transfers: +2 physical assistance;Mod assist       General transfer comment: Two person mod assist to stand and pivot to [facility] initally, one person mod to transfer back to bed after toileting.   Ambulation/Gait             General Gait Details: Unable to safely at this time.  Very impulsive, O2, IV and monitor.    Modified Rankin (Stroke Patients Only) Modified Rankin (Stroke Patients Only) Pre-Morbid Rankin Score: No symptoms Modified Rankin: Moderately severe disability     Balance Overall balance assessment: Needs assistance Sitting-balance support: Feet supported;No upper extremity supported Sitting balance-Leahy Scale: Good Sitting balance - Comments: supervision EOB.    Standing balance support: Bilateral upper extremity supported Standing balance-Leahy Scale: Poor Standing balance comment: reliant on physical assist to stand and transfer                             Pertinent Vitals/Pain Pain Assessment: No/denies pain    Home Living Family/patient expects to be discharged to:: Private residence Living Arrangements: Alone  Additional Comments: Pt unable to report.     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        Extremity/Trunk Assessment   Upper Extremity Assessment Upper Extremity Assessment: Defer to OT evaluation    Lower Extremity Assessment Lower Extremity Assessment: Generalized weakness    Cervical / Trunk Assessment Cervical / Trunk Assessment: Normal   Communication   Communication: Expressive difficulties  Cognition Arousal/Alertness: Awake/alert Behavior During Therapy: Restless;Anxious;Impulsive Overall Cognitive Status: Impaired/Different from baseline Area of Impairment: Orientation;Memory;Attention;Following commands;Safety/judgement;Awareness;Problem solving                 Orientation Level: Disoriented to;Place;Situation;Time Current Attention Level: Sustained Memory: Decreased short-term memory Following Commands: Follows one step commands consistently Safety/Judgement: Decreased awareness of safety;Decreased awareness of deficits Awareness: Intellectual Problem Solving: Difficulty sequencing;Requires verbal cues;Requires tactile cues General Comments: Pt anxious, yelling "help me" nearly constantly without ability to say what she needs help with.  Can answer some yes/no with 75% accuracy.  Can say some single words.       General Comments General comments (skin integrity, edema, etc.): HR increased to 130s from 120s at rest during mobility.  O2 sats in the mid to upper 80s on 3 L O2 [place].          Assessment/Plan    PT Assessment Patient needs continued PT services  PT Problem List Decreased activity tolerance;Decreased balance;Decreased mobility;Decreased cognition;Decreased knowledge of use of DME;Decreased safety awareness;Decreased knowledge of precautions       PT Treatment Interventions DME instruction;Gait training;Stair training;Functional mobility training;Therapeutic activities;Therapeutic exercise;Balance training;Neuromuscular re-education;Cognitive remediation;Patient/family education    PT Goals (Current goals can be found in the Care Plan section)  Acute Rehab PT Goals Patient Stated Goal: Unstated PT Goal Formulation: Patient unable to participate in goal setting Time For Goal Achievement: 10/09/19 Potential to Achieve Goals: Good    Frequency Min 4X/week        AM-PAC PT "6 Clicks"  Mobility  Outcome Measure Help needed turning from your back to your side while in a flat bed without using bedrails?: A Little Help needed moving from lying on your back to sitting on the side of a flat bed without using bedrails?: A Little Help needed moving to and from a bed to a chair (including a wheelchair)?: A Lot Help needed standing up from a chair using your arms (e.g., wheelchair or bedside chair)?: A Lot Help needed to walk in hospital room?: A Lot Help needed climbing 3-5 steps with a railing? : Total 6 Click Score: 13    End of Session Equipment Utilized During Treatment: Oxygen Activity Tolerance: Patient tolerated treatment well Patient left: in bed;with call bell/phone within reach;with bed alarm set Nurse Communication: Mobility status PT Visit Diagnosis: Unsteadiness on feet (R26.81);Muscle weakness (generalized) (M62.81);Difficulty in walking, not elsewhere classified (R26.2);Other symptoms and signs involving the nervous system (R29.898)    Time: 8413-2440 PT Time Calculation (min) (ACUTE ONLY): 27 min   Charges:           Verdene Lennert, PT, DPT  Acute Rehabilitation [phone] pager #[phone] office  @ [facility]: [phone]   PT Evaluation $PT Eval Moderate Complexity: 1 Mod PT Treatments $Therapeutic Activity: 8-22 mins        09/25/2019, 3:12 PM

## 2019-09-25 NOTE — Progress Notes (Signed)
Pt returned from MRI unable to complete all portions per ems. Attempt to gve meds but pt is too sleepy post Ativan, unable to follow cues. Only able to give lopressor suspension orally.

## 2019-09-25 NOTE — Progress Notes (Signed)
PROGRESS NOTE                                                                                                                                                                                                             Patient Demographics:    Christie Harper, is a 78 y.o. female, DOB - [birth year], RAQ:[id number]  Admit date - 09/20/2019   Admitting Physician Juanito Doom, MD  Outpatient Primary MD for the patient is System, Pcp Not In  LOS - 5   No chief complaint on file.      Brief Narrative    78 year old femalewith a history of HTN, severe PAD (Pletal) s/p bilateral iliac and femoral stenting, carotid stenosis (complete L occlusion, 50% R), unprovoked DVT/PE (Xarelto indefinitely), CKD III, h/o BRCA s/pmastectomy and chemo, former tobacco use (quit 2008).  initially admitted to [facility] on November 21 in the setting of mild hypoxemia and diarrhea.  Covid test became positive on November 22.  Treated with Decadron for 10 days, remdesivir 5 days.  Hypoxemia progressed.  Intubated on November 30.  Transferred to [facility] for further management by pulmonary critical care on December  11/21 admit [place] hospital diarrhea, mild hypoxemia 11/22 COVID test positive, started remdesivir and decadron 11/24 4-8 L O2 11/30 started heated high flow O2, new fever, intubated 12/2 transfer to [facility] 12/4 extubated   Subjective:    Christie Harper herself cannot provide any complaints, but no significant events as I discussed with staff.   Assessment  & Plan :    Active Problems:   Acute respiratory disease due to COVID-19 virus   Peripheral arterial disease ([place])   Hypertension   Former smoker   Carotid artery disease ([place])   Cardiomyopathy ([place])   Breast cancer ([place])  Acute hypoxic respiratory failure/ARDS due to COVID-19 pneumonia -Patient required intubation at OSH, transferred to [facility] for [facility]  management. -Vent management per PCCM, on ARDS protocol, sedation per PCCM as well.  Patient was successfully extubated 12/4, she remains on nasal cannula today. -Lasix as needed -She finished remdesivir treatment -Continue with steroids. -she is late in her disease to consider Actemra or plasma. -Patient presents with fever, on cefepime to finished total of 5 days -Continue to monitor inflammatory markers closely.   [place]  09/23/19 0435 09/24/19 0400 09/25/19 0130  DDIMER 2.42* 2.31* 2.08*  FERRITIN 746* 836* 898*  CRP 19.4* 11.0* 6.9*   Slurred speech/facial droop -Patient appears to be having facial droop, and slurred speech, exact onset is unclear as she was intubated arrival to our facility, most likely she is having watershed infarct especially with known history of total left ICA occlusion and 50% right ICA occlusion . -Is already on full dose Lovenox, Pletal, ADDED aspirin. -Discussed with neurology, recommendation for MRI brain, MRA head and neck without contrast.  Will be able to obtain MRI this evening.   Sinus arrhythmias -At one point it was thought patient is having A. fib, on amiodarone drip, but reviewing records telemetry strips and EKGs, does not appear to be A. Fib, it appears to be sinus tachycardia with multiple PVCs, amiodarone drip has been stopped.  Chronic systolic CHF -EF 75% with apical akinesis, she is already on full dose Lovenox for known history of PE/DVT. -Per outpatient cardiologist note, most recent cardiac cath 2012, significant for nonobstructive 60% mid LAD. -On as needed IV Lasix, monitor volume status closely.  Hyperlipidemia -Continue with statin  History of PE/DVT -Unprovoked, continue with anticoagulation, on Xarelto as outpatient, currently on Lovenox during hospital stay.  History of CAD -Please see above discussion, 2D echo with apical akinesis,  continue with statin, and beta-blockers.  History of severe  PVD/carotid artery disease -Status post multiple stenting, continue with Pletal.  Goals of care:  -Cussed with her sister, patient has no children, she is her next of kin, and discussed goals of care, patient sister would like to continue with full medical management currently, including vent support, and pressors as needed, but agreeable to patient no CPR, no CODE BLUE, but if patient need to be reintubated should be performed.      Code Status : Patient is partial code, no CPR, no CODE BLUE, but she can be reintubated electively if indicated or does not do well after extubation.  Family Communication  : Sister updated 12/6  Disposition Plan  : Remains in ICU  Consults  :  PCCM  Procedures  :  None  DVT Prophylaxis  :  Lovenox  Lab Results  Component Value Date   PLT 232 09/25/2019    Antibiotics  :    Anti-infectives (From admission, onward)   Start     Dose/Rate Route Frequency Ordered Stop   09/21/19 1100  ceFEPIme (MAXIPIME) 2 g in sodium chloride 0.9 % 100 mL IVPB     2 g 200 mL/hr over 30 Minutes Intravenous Every 12 hours 09/21/19 1022 09/25/19 2359        Objective:   Vitals:   09/25/19 0803 09/25/19 0900 09/25/19 1059 09/25/19 1556  BP: 137/80  134/73 (!) 143/77  Pulse: (!) 119  (!) 116 (!) 119  Resp: '18  18 20  ' Temp: 97.6 F (36.4 C)  98.4 F (36.9 C) 98.7 F (37.1 C)  TempSrc: Oral  Axillary Axillary  SpO2: (!) 89% 91% 94% 91%  Weight:      Height:        Wt Readings from Last 3 Encounters:  09/25/19 72.5 kg     Intake/Output Summary (Last 24 hours) at 09/25/2019 1847 Last data filed at 09/25/2019 1757 Gross per 24 hour  Intake 940 ml  Output -  Net 940 ml     Physical Exam  Awake Alert, confused, with aphasia and some facial droop, anxious Symmetrical Chest wall movement,  Good air movement bilaterally, CTAB RRR,No Gallops,Rubs or new Murmurs, No Parasternal Heave +ve B.Sounds, Abd Soft, No tenderness, No rebound - guarding or  rigidity. No Cyanosis, Clubbing or edema, No new Rash or bruise  , will to move all extremities without gross deficit.     Data Review:    CBC Recent Labs  Lab 09/21/19 0400 09/22/19 0345 09/22/19 1032 09/23/19 0435 09/24/19 0400 09/25/19 0130  WBC 8.3 10.5  --  10.4 12.7* 7.7  HGB 13.1 12.7 12.2 13.8 13.3 11.9*  HCT 41.5 40.7 36.0 43.2 41.6 37.2  PLT 197 201  --  230 263 232  MCV 95.8 96.0  --  94.5 94.1 93.9  MCH 30.3 30.0  --  30.2 30.1 30.1  MCHC 31.6 31.2  --  31.9 32.0 32.0  RDW 14.1 14.2  --  14.0 14.1 14.2  LYMPHSABS 0.3* 0.3*  --  0.8 0.4* 0.7  MONOABS 0.6 0.6  --  0.7 0.7 0.6  EOSABS 0.0 0.0  --  0.0 0.0 0.0  BASOSABS 0.0 0.0  --  0.0 0.0 0.0    Chemistries  Recent Labs  Lab 09/21/19 0400 09/21/19 1630 09/22/19 0345 09/22/19 1032 09/23/19 0435 09/24/19 0400 09/25/19 0130  NA 138  --  141 141 148* 145 140  K 3.6  --  3.7 3.7 4.3 3.6 3.7  CL 99  --  97*  --  96* 96* 95*  CO2 29  --  33*  --  37* 34* 33*  GLUCOSE 124*  --  180*  --  118* 136* 109*  BUN 65*  --  67*  --  70* 67* 64*  CREATININE 1.14*  --  1.29*  --  1.30* 1.31* 1.19*  CALCIUM 8.6*  --  9.0  --  9.6 9.8 9.4  MG 2.1 1.9 2.0  --  2.2 2.2  --   AST 16  --  21  --  '27 30 24  ' ALT 15  --  18  --  '24 25 22  ' ALKPHOS 48  --  50  --  58 57 54  BILITOT 0.7  --  0.8  --  0.6 1.1 0.8   ------------------------------------------------------------------------------------------------------------------ Recent Labs    09/25/19 0130  CHOL 123  HDL 31*  LDLCALC 69  TRIG 114  CHOLHDL 4.0    Lab Results  Component Value Date   HGBA1C 6.1 (H) 09/20/2019   ------------------------------------------------------------------------------------------------------------------ No results for input(s): TSH, T4TOTAL, T3FREE, THYROIDAB in the last 72 hours.  Invalid input(s): FREET3 ------------------------------------------------------------------------------------------------------------------ Recent  Labs    09/24/19 0400 09/25/19 0130  FERRITIN 836* 898*    Coagulation profile No results for input(s): INR, PROTIME in the last 168 hours.  Recent Labs    09/24/19 0400 09/25/19 0130  DDIMER 2.31* 2.08*    Cardiac Enzymes No results for input(s): CKMB, TROPONINI, MYOGLOBIN in the last 168 hours.  Invalid input(s): CK ------------------------------------------------------------------------------------------------------------------    Component Value Date/Time   BNP 29.1 09/20/2019 1415    Inpatient Medications  Scheduled Meds: . aspirin EC  81 mg Oral Daily  . Chlorhexidine Gluconate Cloth  6 each Topical Daily  . cilostazol  50 mg Oral BID  . enoxaparin (LOVENOX) injection  70 mg Subcutaneous Q12H  . insulin aspart  0-6 Units Subcutaneous Q4H  . mouth rinse  15 mL Mouth Rinse BID  . metoprolol tartrate  25 mg Per Tube BID  . senna-docusate  3 tablet Oral BID  . simvastatin  20 mg Oral QHS  . traZODone  50 mg Oral QHS  . vitamin C  500 mg Per Tube Daily  . zinc sulfate  220 mg Per Tube Daily   Continuous Infusions: . ceFEPime (MAXIPIME) IV 2 g (09/25/19 1059)  . feeding supplement (VITAL 1.5 CAL) 1,000 mL (09/20/19 1739)  . phenylephrine (NEO-SYNEPHRINE) Adult infusion Stopped (09/21/19 0348)   PRN Meds:.ALPRAZolam, LORazepam, metoprolol tartrate, ondansetron (ZOFRAN) IV  Micro Results Recent Results (from the past 240 hour(s))  Culture, blood (routine x 2)     Status: None (Preliminary result)   Collection Time: 09/21/19  8:40 AM   Specimen: BLOOD  Result Value Ref Range Status   Specimen Description   Final    BLOOD RIGHT HAND Performed at [facility] [address], [place], [place] [address]    Special Requests   Final    BOTTLES DRAWN AEROBIC AND ANAEROBIC Blood Culture adequate volume Performed at [facility] [address], [place], [place] [address]    Culture   Final    NO GROWTH 4 DAYS Performed at  [facility], [place] [address], [place], [place] [address]    Report Status PENDING  Incomplete  Culture, blood (routine x 2)     Status: None (Preliminary result)   Collection Time: 09/21/19  8:50 AM   Specimen: BLOOD  Result Value Ref Range Status   Specimen Description   Final    BLOOD RIGHT HAND Performed at [facility] [address] [place], [place] [address]    Special Requests   Final    BOTTLES DRAWN AEROBIC ONLY Blood Culture results may not be optimal due to an inadequate volume of blood received in culture bottles   Culture   Final    NO GROWTH 4 DAYS Performed at [facility], [place] [address], [place],  [address]    Report Status PENDING  Incomplete  MRSA PCR Screening     Status: None   Collection Time: 09/22/19  1:35 AM   Specimen: Nasopharyngeal  Result Value Ref Range Status   MRSA by PCR NEGATIVE NEGATIVE Final    Comment:        The GeneXpert MRSA Assay (FDA approved for NASAL specimens only), is one component of a comprehensive MRSA colonization surveillance program. It is not intended to diagnose MRSA infection nor to guide or monitor treatment for MRSA infections. Performed at [facility], [place] [address] [place],  [address]     Radiology Reports Ct Head Wo Contrast  Result Date: 09/23/2019 CLINICAL DATA:  Acute speech disturbance.  Coronavirus infection. EXAM: CT HEAD WITHOUT CONTRAST TECHNIQUE: Contiguous axial images were obtained from the base of the skull through the vertex without intravenous contrast. COMPARISON:  None. FINDINGS: Brain: Age related atrophy. Chronic small-vessel ischemic change of the pons. No focal cerebellar insult. 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 No sign of acute infarction, mass lesion, hemorrhage, hydrocephalus or extra-axial collection. Vascular:  There is atherosclerotic calcification of the major vessels at the base of the brain. Skull: Negative Sinuses/Orbits: Clear/normal Other: None IMPRESSION: No acute finding by CT. 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 No sign of acute insult at this time. Electronically Signed   By: Nelson Chimes M.D.   On: 09/23/2019 09:43   Dg Chest [facility]  1 View  Result Date: 09/23/2019 CLINICAL DATA:  Hypoxia EXAM: PORTABLE CHEST - 1 VIEW COMPARISON:  09/20/2019 FINDINGS: Patient has been extubated and the nasogastric tube removed. Right arm PICC stable. Persistent interstitial opacities throughout both mid lung fields. Slight worsening of patchy airspace opacities in both lung bases. Heart size stable.  Aortic Atherosclerosis (ICD10-170.0). No definite effusion. No pneumothorax. Visualized bones unremarkable. IMPRESSION: 1. Slight worsening of patchy airspace opacities in both lung bases. 2. Stable interstitial opacities throughout both lungs. 3. Stable support apparatus. 4. Aortic Atherosclerosis (ICD10-170.0). Electronically Signed   By: Lucrezia Europe M.D.   On: 09/23/2019 14:25   Portable Chest 1 View  Result Date: 09/20/2019 CLINICAL DATA:  Shortness of breath EXAM: PORTABLE CHEST 1 VIEW COMPARISON:  None. FINDINGS: Patchy areas of interstitial airspace disease throughout the right lung and to lesser extent left mid lung. No pleural effusion or pneumothorax. Stable cardiomediastinal silhouette. No aggressive osseous lesion. Endotracheal tube with the tip 6 cm above the carina. Right-sided PICC line with the tip projecting over the SVC. Nasogastric tube coursing below the diaphragm. No acute osseous abnormality. IMPRESSION: 1. Patchy areas of interstitial airspace disease throughout the right lung and to lesser extent left mid lung concerning for atypical viral pneumonia. Electronically Signed   By: Kathreen Devoid   On: 09/20/2019 13:45      Phillips Climes M.D on 09/25/2019 at 6:47 PM  Between 7am to 7pm - Pager - [phone]  After 7pm go to [email or web address] - password [facility]  [facility] -  Office  [phone]

## 2019-09-25 NOTE — Progress Notes (Addendum)
PT brought from [facility]. PT not A&O. Pt was banging on bore and trying to remove head coil. Per Dr. Glendora Score, ok to discontinue scan. Spoke to Dr. Waldron Labs about Radiologist recommedation for a CTA Neck instead. Okay to cancel MRA Neck.

## 2019-09-25 NOTE — Progress Notes (Signed)
[facility] called, MRI with transport pushed from 1800 to 2000 for tonight, confirmed with Zacarias Pontes.

## 2019-09-26 DIAGNOSIS — E1122 Type 2 diabetes mellitus with diabetic chronic kidney disease: Secondary | ICD-10-CM

## 2019-09-26 DIAGNOSIS — E1169 Type 2 diabetes mellitus with other specified complication: Secondary | ICD-10-CM

## 2019-09-26 DIAGNOSIS — N183 Chronic kidney disease, stage 3 unspecified: Secondary | ICD-10-CM

## 2019-09-26 DIAGNOSIS — E785 Hyperlipidemia, unspecified: Secondary | ICD-10-CM

## 2019-09-26 LAB — GLUCOSE, CAPILLARY
Glucose-Capillary: 78 mg/dL (ref 70–99)
Glucose-Capillary: 83 mg/dL (ref 70–99)
Glucose-Capillary: 92 mg/dL (ref 70–99)
Glucose-Capillary: 66 mg/dL — ABNORMAL LOW (ref 70–99)
Glucose-Capillary: 71 mg/dL (ref 70–99)
Glucose-Capillary: 115 mg/dL — ABNORMAL HIGH (ref 70–99)

## 2019-09-26 LAB — CULTURE, BLOOD (ROUTINE X 2)
Culture: NO GROWTH
Culture: NO GROWTH
Special Requests: ADEQUATE

## 2019-09-26 MED ORDER — HALOPERIDOL LACTATE 5 MG/ML IJ SOLN: 1.0000 mg | INTRAMUSCULAR | Status: DC | PRN

## 2019-09-26 MED ORDER — ADULT MULTIVITAMIN W/MINERALS CH
1.0000 | ORAL_TABLET | Freq: Every day | ORAL | Status: DC
  Administered 2019-09-26 – 2019-10-07 (×12): 1 via ORAL
  Filled 2019-09-26 (×12): qty 1

## 2019-09-26 MED ORDER — ENSURE ENLIVE PO LIQD
237.0000 mL | Freq: Two times a day (BID) | ORAL | Status: DC
  Administered 2019-09-26 – 2019-09-27 (×2): 237 mL via ORAL

## 2019-09-26 MED ORDER — HALOPERIDOL LACTATE 5 MG/ML IJ SOLN
2.0000 mg | Freq: Once | INTRAMUSCULAR | Status: AC
  Administered 2019-09-26: 2 mg via INTRAMUSCULAR
  Filled 2019-09-26: qty 1

## 2019-09-26 MED ORDER — HALOPERIDOL 0.5 MG PO TABS
1.0000 mg | ORAL_TABLET | ORAL | Status: DC | PRN
  Administered 2019-09-26 – 2019-09-27 (×4): 1 mg via ORAL
  Filled 2019-09-26: qty 2
  Filled 2019-09-26: qty 1
  Filled 2019-09-26: qty 2
  Filled 2019-09-26 (×2): qty 1
  Filled 2019-09-26: qty 2

## 2019-09-26 NOTE — Progress Notes (Signed)
Nutrition Follow up  RD working remotely.  DOCUMENTATION CODES:   Not applicable  INTERVENTION:    Ensure Enlive po BID, each supplement provides 350 kcal and 20 grams of protein  Magic cup BID with meals, each supplement provides 290 kcal and 9 grams of protein   MVI daily  NUTRITION DIAGNOSIS:   Increased nutrient needs related to acute illness(COVID-19) as evidenced by estimated needs.  Ongoing  GOAL:   Patient will meet greater than or equal to 90% of their needs   Progressing   MONITOR:   TF tolerance, Vent status, Labs  REASON FOR ASSESSMENT:   Ventilator, Consult Enteral/tube feeding initiation and management  ASSESSMENT:   78 yo female admitted to [place] from [facility] on 12/2 with ongoing respiratory distress and for further management by pulmonary critical care. COVID positive on 11/22. Intubated on 11/30. PMH includes breast cancer, former smoker, HTN, CKD-3, DVT.   12/4- extubated   Diet advanced to DYS 3 with thin liquids. Meal completions charted as 5-75% for her last four meals. No breakfast/lunch documented today. RD to provide supplements to maximize kcal and protein this admission.   Admission weight: 74 kg  Current weight: 72.5 kg   Medications: SS novolog, senokot, 500 mg Vit C, Zinc 220 mg daily  Labs: CBG 66-160   Diet Order:   Diet Order            DIET DYS 3 Room service appropriate? Yes; Fluid consistency: Thin  Diet effective now              EDUCATION NEEDS:   Not appropriate for education at this time  Skin:  Skin Assessment: Skin Integrity Issues: Skin Integrity Issues:: Other (Comment) Other: pressure injury from ETT  Last BM:  12/6  Height:   Ht Readings from Last 1 Encounters:  09/20/19 5\' 7"  (1.702 m)    Weight:   Wt Readings from Last 1 Encounters:  09/26/19 74 kg    Ideal Body Weight:  61.4 kg  BMI:  Body mass index is 25.55 kg/m.  Estimated Nutritional Needs:   Kcal:   1850-2100  Protein:  110-130 gm  Fluid:  >/= 1.9 L   Mariana Single RD, LDN Clinical Nutrition Pager # [phone]

## 2019-09-26 NOTE — Progress Notes (Addendum)
  Speech Language Pathology Treatment: Dysphagia;Cognitive-Linquistic  Patient Details Name: Christie Harper MRN: [id number] DOB: Feb 18, 1941 Today's Date: 09/26/2019 Time: 3794-3276 SLP Time Calculation (min) (ACUTE ONLY): 15 min  Assessment / Plan / Recommendation Clinical Impression  Most impactful factor limiting progress with cognitive goals is her level of anxiety. RN present and pt continuously repeating "Oh jesus, don't let me die. Jesus help me". She did attend to water and solid presentations. Coughed within 10 seconds of masticating cracker due to oral incoordination, particulate nature of cracker and pt verbalizing. No s/s aspiration post straw sips water. Agree with continuing Dys 3 texture, thin liquids and encouragement to remain quiet while eating.   Hyper focused on asking for help, denying pain and unable to encode information re: place, situation and time. She was able to state she has Covid after reviewed but not that she was in the hospital with 3 and 8 min delays. Intermittently she has difficulty processing information and states she doesn't understand. Will follow up once more re: swallow and continue for cognition however may not progress in anxiety continues.    HPI HPI: 78 year old femalewith a history of HTN, severe PAD (Pletal) s/p bilateral iliac and femoral stenting, carotid stenosis (complete L occlusion, 50% R), unprovoked DVT/PE (Xarelto indefinitely), CKD III, h/o BRCA s/p mastectomy and chemo, former tobacco use (quit 2008).  initially admitted to [facility] on November 21 in the setting of mild hypoxemia and diarrhea.  Covid test became positive on November 22.  Treated with Decadron for 10 days, remdesivir 5 days.  Hypoxemia progressed.  Intubated on November 30-12/4.      SLP Plan  Continue with current plan of care       Recommendations  Diet recommendations: Dysphagia 3 (mechanical soft);Thin liquid Liquids provided via: Cup;Straw Medication  Administration: Whole meds with puree Supervision: Patient able to self feed;Intermittent supervision to cue for compensatory strategies Compensations: Slow rate;Small sips/bites;Minimize environmental distractions Postural Changes and/or Swallow Maneuvers: Seated upright 90 degrees                Oral Care Recommendations: Oral care BID Follow up Recommendations: Inpatient Rehab SLP Visit Diagnosis: Cognitive communication deficit (R41.841);Dysphagia, unspecified (R13.10) Plan: Continue with current plan of care       GO                Houston Siren 09/26/2019, 3:14 PM  Orbie Pyo Colvin Caroli.Ed Risk analyst [phone] Office [phone]

## 2019-09-26 NOTE — Progress Notes (Signed)
Pt continues to get oob frequently setting off alarm. Request for virtual sitter from MD

## 2019-09-26 NOTE — Progress Notes (Signed)
[place] for Lovenox Indication: PTA Xarelto for hx DVT  Not on File  Patient Measurements: Height: 5\' 7"  (170.2 cm) Weight: 163 lb 1.6 oz (74 kg) IBW/kg (Calculated) : 61.6  Vital Signs: Temp: 98.1 F (36.7 C) (12/08 0803) Temp Source: Axillary (12/08 0803) BP: 120/67 (12/08 0803) Pulse Rate: 82 (12/08 0000)  Labs: Recent Labs    09/24/19 0400 09/25/19 0130  HGB 13.3 11.9*  HCT 41.6 37.2  PLT 263 232  CREATININE 1.31* 1.19*    Estimated Creatinine Clearance: 39.6 mL/min (A) (by C-G formula based on SCr of 1.19 mg/dL (H)).  Medications:  Scheduled:  . aspirin EC  81 mg Oral Daily  . Chlorhexidine Gluconate Cloth  6 each Topical Daily  . cilostazol  50 mg Oral BID  . enoxaparin (LOVENOX) injection  70 mg Subcutaneous Q12H  . insulin aspart  0-6 Units Subcutaneous Q4H  . mouth rinse  15 mL Mouth Rinse BID  . metoprolol tartrate  25 mg Per Tube BID  . senna-docusate  3 tablet Oral BID  . simvastatin  20 mg Oral QHS  . traZODone  50 mg Oral QHS  . vitamin C  500 mg Per Tube Daily  . zinc sulfate  220 mg Per Tube Daily   Infusions:  . feeding supplement (VITAL 1.5 CAL) 1,000 mL (09/20/19 1739)  . phenylephrine (NEO-SYNEPHRINE) Adult infusion Stopped (09/21/19 0348)    Assessment: 78 yoF transferred from [facility] on 12/[facility].  Hear most recent dose of Xarelto at [facility] was on 12/1 at 17:34.  Pharmacy was consulted to transition to Lovenox.  SCr 1.19 improved, D-dimer <5, slight wt inc, Hgb to 11.9 no bleeding reported.    Goal of Therapy:  Anti-Xa level 0.6-1 units/ml 4hrs after LMWH dose given Monitor platelets by anticoagulation protocol: Yes   Plan:  Lovenox 1 mg/kg (70 mg) Anon Ra[place] q12h. Monitor renal function, wt, CBC, s/s bleeding  Bertis Ruddy, PharmD Clinical Pharmacist Please check AMION for all [place] numbers 09/26/2019 8:41 AM

## 2019-09-26 NOTE — Progress Notes (Signed)
Pt inconsolable for several hours now. We have used the bsc, drank fluids, bathed and other diversion activities, she continues to scream and try to crawl out of bed, ripping off all her wires over and over. Medicated with prn med and scheduled med she was unable to take earlier in shift.

## 2019-09-26 NOTE — Progress Notes (Addendum)
PROGRESS NOTE    Sharrone Cupps  [id number] DOB: Jan 11, 1941 DOA: 09/20/2019 PCP: System, Pcp Not In    Brief Narrative:  78 year old female who presented with dyspnea.  She was admitted to [facility] November 21 in the setting of mild hypoxemia and diarrhea.  Her COVID-19 test came back positive November 22.  Patient developed progressive hypoxic respiratory failure despite 10 days of dexamethasone and 5 days of remdesivir. She was intubated November 30, transferred to [facility] December 2.  Patient received mechanical ventilation per ARDS-net protocol, with good toleration.  Patient was liberated from mechanical ventilation on December 4.   Her hospitalization was been omplicated with bacterial pneumonia  coinfection requiring antibiotic therapy, acute focal neurologic deficit with slurred speech and facial droop (negative MRI for acute CVA ) and acute on chronic systolic heart failure.  Her CODE STATUS has been changed to DNR.   Assessment & Plan:   Active Problems:   Acute respiratory disease due to COVID-19 virus   Peripheral arterial disease ([place])   Hypertension   Former smoker   Carotid artery disease ([place])   Cardiomyopathy ([place])   Breast cancer ([place])   1.  Acute hypoxic respiratory failure due to SARS COVID-19 viral pneumonia. Patient completed antiviral therapy with Remdesivir #5/5. Antibiotic therapy with cefepime. Systemic steroids have been discontinued.   RR: 20  Pulse oxymetry: 93%  Fi02: 8 LPM per HFNC.   Continue supportive medical therapy with antitussive agents, bronchodilators and airway clearing techniques with flutter valve and incentive spirometer.   Physical therapy and occupational therapy. Out of bed to chair tid with meals.   2.  Suspected TIA. This am with no further clinical neurological deficits, brain MRI with no acute CVA (motion degraded examination). Will continue with asa, statin and neuro checks. Physical and  occupational therapy. Dysphagia 3 diet per speech therapy, continue aspiration precautions.   3.  HTN and systolic heart failure (EF 35%). No sing of acute decompensation, will continue blood pressure monitor. Continue with metoprolol.   4.  Dyslipidemia. Continue with statin therapy.   5.  History of PE/DVT. Continue with anticoagulation with enoxaparin for now.   6.  Coronary artery disease. No active chest pain, continue antiplatelet therapy.   7.  Severe peripheral vascular disease and carotid artery disease. Continue with statin and asa and cilostazol. Follow up as outpatient.   8. T2DM. Continue with insulin sliding scale for glucose cover and monitoring.   9. CKD stage 3b. Renal function stable with serum cr at 1,19, with K at 3,7 and serum bicarbonate at 33. Will continue to follow up on renal function in am, avoid hypotension and nephrotoxic medications.    DVT prophylaxis:   Code Status: DNR (ok to intubate if needed)  Family Communication: no family at the bedside  Disposition Plan/ discharge barriers: pending clinical improvement.   Body mass index is 25.55 kg/m. Malnutrition Type:  Nutrition Problem: Increased nutrient needs Etiology: acute illness(COVID-19)   Malnutrition Characteristics:  Signs/Symptoms: estimated needs   Nutrition Interventions:  Interventions: Tube feeding     Consultants:  Neurology   Procedures:     Antimicrobials:       Subjective: Patient is feeling better, but continue to be very weak and deconditioned, no nausea or vomiting. No focal deficit, dyspnea has been improving.   Objective: Vitals:   09/26/19 0151 09/26/19 0345 09/26/19 0500 09/26/19 0803  BP:    120/67  Pulse:      Resp:  Temp:  97.9 F (36.6 C)  98.1 F (36.7 C)  TempSrc:  Axillary  Axillary  SpO2: 93% 90%  95%  Weight:   74 kg   Height:        Intake/Output Summary (Last 24 hours) at 09/26/2019 1002 Last data filed at 09/25/2019 1757 Gross  per 24 hour  Intake 570 ml  Output -  Net 570 ml   Filed Weights   09/24/19 0410 09/25/19 0124 09/26/19 0500  Weight: 71.1 kg 72.5 kg 74 kg    Examination:   General: Not in pain or dyspnea, deconditioned  Neurology: Awake and alert, non focal  E ENT: mild pallor, no icterus, oral mucosa moist Cardiovascular: No JVD. S1-S2 present. No lower extremity edema. Pulmonary: positive breath sounds bilaterally. Gastrointestinal. Abdomen positive, no organomegaly, non tender, no rebound or guarding Skin. No rashes Musculoskeletal: no joint deformities     Data Reviewed: I have personally reviewed following labs and imaging studies  CBC: Recent Labs  Lab 09/21/19 0400 09/22/19 0345 09/22/19 1032 09/23/19 0435 09/24/19 0400 09/25/19 0130  WBC 8.3 10.5  --  10.4 12.7* 7.7  NEUTROABS 7.4 9.4*  --  8.9* 11.5* 6.3  HGB 13.1 12.7 12.2 13.8 13.3 11.9*  HCT 41.5 40.7 36.0 43.2 41.6 37.2  MCV 95.8 96.0  --  94.5 94.1 93.9  PLT 197 201  --  230 263 [id number]   Basic Metabolic Panel: Recent Labs  Lab 09/21/19 0400 09/21/19 1630 09/22/19 0345 09/22/19 1032 09/23/19 0435 09/24/19 0400 09/25/19 0130  NA 138  --  141 141 148* 145 140  K 3.6  --  3.7 3.7 4.3 3.6 3.7  CL 99  --  97*  --  96* 96* 95*  CO2 29  --  33*  --  37* 34* 33*  GLUCOSE 124*  --  180*  --  118* 136* 109*  BUN 65*  --  67*  --  70* 67* 64*  CREATININE 1.14*  --  1.29*  --  1.30* 1.31* 1.19*  CALCIUM 8.6*  --  9.0  --  9.6 9.8 9.4  MG 2.1 1.9 2.0  --  2.2 2.2  --   PHOS 4.1 3.5 3.7  --  2.7 3.8  --    GFR: Estimated Creatinine Clearance: 39.6 mL/min (A) (by C-G formula based on SCr of 1.19 mg/dL (H)). Liver Function Tests: Recent Labs  Lab 09/21/19 0400 09/22/19 0345 09/23/19 0435 09/24/19 0400 09/25/19 0130  AST 16 21 27 30 24   ALT 15 18 24 25 22   ALKPHOS 48 50 58 57 54  BILITOT 0.7 0.8 0.6 1.1 0.8  PROT 6.0* 5.9* 6.9 6.8 6.2*  ALBUMIN 2.6* 2.3* 2.6* 2.6* 2.2*   No results for input(s): LIPASE,  AMYLASE in the last 168 hours. No results for input(s): AMMONIA in the last 168 hours. Coagulation Profile: No results for input(s): INR, PROTIME in the last 168 hours. Cardiac Enzymes: No results for input(s): CKTOTAL, CKMB, CKMBINDEX, TROPONINI in the last 168 hours. BNP (last 3 results) No results for input(s): PROBNP in the last 8760 hours. HbA1C: No results for input(s): HGBA1C in the last 72 hours. CBG: Recent Labs  Lab 09/25/19 1626 09/25/19 2003 09/26/19 0033 09/26/19 0341 09/26/19 0736  GLUCAP 91 128* 83 78 92   Lipid Profile: Recent Labs    09/25/19 0130  CHOL 123  HDL 31*  LDLCALC 69  TRIG 114  CHOLHDL 4.0   Thyroid Function Tests: No results for  input(s): TSH, T4TOTAL, FREET4, T3FREE, THYROIDAB in the last 72 hours. Anemia Panel: Recent Labs    09/24/19 0400 09/25/19 0130  FERRITIN 836* 898*      Radiology Studies: I have reviewed all of the imaging during this hospital visit personally     Scheduled Meds: . aspirin EC  81 mg Oral Daily  . Chlorhexidine Gluconate Cloth  6 each Topical Daily  . cilostazol  50 mg Oral BID  . enoxaparin (LOVENOX) injection  70 mg Subcutaneous Q12H  . insulin aspart  0-6 Units Subcutaneous Q4H  . mouth rinse  15 mL Mouth Rinse BID  . metoprolol tartrate  25 mg Per Tube BID  . senna-docusate  3 tablet Oral BID  . simvastatin  20 mg Oral QHS  . traZODone  50 mg Oral QHS  . vitamin C  500 mg Per Tube Daily  . zinc sulfate  220 mg Per Tube Daily   Continuous Infusions: . feeding supplement (VITAL 1.5 CAL) 1,000 mL (09/20/19 1739)  . phenylephrine (NEO-SYNEPHRINE) Adult infusion Stopped (09/21/19 0348)     LOS: 6 days        Mauricio Gerome Apley, MD

## 2019-09-27 DIAGNOSIS — J1289 Other viral pneumonia: Secondary | ICD-10-CM

## 2019-09-27 DIAGNOSIS — E876 Hypokalemia: Secondary | ICD-10-CM

## 2019-09-27 DIAGNOSIS — J9601 Acute respiratory failure with hypoxia: Secondary | ICD-10-CM

## 2019-09-27 DIAGNOSIS — G9341 Metabolic encephalopathy: Secondary | ICD-10-CM

## 2019-09-27 LAB — BASIC METABOLIC PANEL
Anion gap: 15 (ref 5–15)
BUN: 39 mg/dL — ABNORMAL HIGH (ref 8–23)
CO2: 25 mmol/L (ref 22–32)
Calcium: 9.2 mg/dL (ref 8.9–10.3)
Chloride: 96 mmol/L — ABNORMAL LOW (ref 98–111)
Creatinine, Ser: 1.11 mg/dL — ABNORMAL HIGH (ref 0.44–1.00)
GFR calc Af Amer: 54 mL/min — ABNORMAL LOW (ref >60)
GFR calc non Af Amer: 47 mL/min — ABNORMAL LOW (ref >60)
Glucose, Bld: 105 mg/dL — ABNORMAL HIGH (ref 70–99)
Potassium: 3.4 mmol/L — ABNORMAL LOW (ref 3.5–5.1)
Sodium: 136 mmol/L (ref 135–145)

## 2019-09-27 LAB — GLUCOSE, CAPILLARY
Glucose-Capillary: 102 mg/dL — ABNORMAL HIGH (ref 70–99)
Glucose-Capillary: 114 mg/dL — ABNORMAL HIGH (ref 70–99)
Glucose-Capillary: 114 mg/dL — ABNORMAL HIGH (ref 70–99)
Glucose-Capillary: 121 mg/dL — ABNORMAL HIGH (ref 70–99)
Glucose-Capillary: 76 mg/dL (ref 70–99)
Glucose-Capillary: 83 mg/dL (ref 70–99)
Glucose-Capillary: 93 mg/dL (ref 70–99)

## 2019-09-27 MED ORDER — POTASSIUM CHLORIDE CRYS ER 20 MEQ PO TBCR
40.0000 meq | EXTENDED_RELEASE_TABLET | Freq: Two times a day (BID) | ORAL | Status: AC
  Administered 2019-09-27 (×2): 40 meq via ORAL
  Filled 2019-09-27 (×2): qty 2

## 2019-09-27 MED ORDER — FUROSEMIDE 10 MG/ML IJ SOLN
40.0000 mg | Freq: Once | INTRAMUSCULAR | Status: AC
  Administered 2019-09-27: 40 mg via INTRAVENOUS
  Filled 2019-09-27: qty 4

## 2019-09-27 MED ORDER — FERROUS SULFATE 325 (65 FE) MG PO TABS
325.0000 mg | ORAL_TABLET | Freq: Two times a day (BID) | ORAL | Status: DC
  Administered 2019-09-27 – 2019-10-07 (×21): 325 mg via ORAL
  Filled 2019-09-27 (×20): qty 1

## 2019-09-27 MED ORDER — ENSURE ENLIVE PO LIQD
237.0000 mL | Freq: Three times a day (TID) | ORAL | Status: DC
  Administered 2019-09-27 – 2019-10-07 (×21): 237 mL via ORAL

## 2019-09-27 MED ORDER — DULOXETINE HCL 60 MG PO CPEP
60.0000 mg | ORAL_CAPSULE | Freq: Two times a day (BID) | ORAL | Status: DC
  Administered 2019-09-27 – 2019-10-07 (×20): 60 mg via ORAL
  Filled 2019-09-27 (×22): qty 1

## 2019-09-27 MED ORDER — METOPROLOL TARTRATE 25 MG PO TABS
25.0000 mg | ORAL_TABLET | Freq: Two times a day (BID) | ORAL | Status: DC
  Administered 2019-09-27 – 2019-10-06 (×19): 25 mg via ORAL
  Filled 2019-09-27 (×19): qty 1

## 2019-09-27 MED ORDER — HALOPERIDOL LACTATE 5 MG/ML IJ SOLN
2.0000 mg | Freq: Four times a day (QID) | INTRAMUSCULAR | Status: DC | PRN
  Administered 2019-09-27 – 2019-10-04 (×13): 2 mg via INTRAVENOUS
  Filled 2019-09-27 (×14): qty 1

## 2019-09-27 NOTE — Progress Notes (Signed)
Physical Therapy Treatment Patient Details Name: Christie Harper MRN: [id number] DOB: [birth year] Today's Date: 09/27/2019    History of Present Illness 78 year old femalewith a history of HTN, severe PAD (Pletal) s/p bilateral iliac and femoral stenting, carotid stenosis (complete L occlusion, 50% R), unprovoked DVT/PE (Xarelto indefinitely), CKD III, h/o BRCA s/p mastectomy and chemo, former tobacco use (quit 2008).  initially admitted to [facility] on November 21 in the setting of mild hypoxemia and diarrhea.  Covid test became positive on November 22.  Treated with Decadron for 10 days, remdesivir 5 days.  Hypoxemia progressed.  Intubated on November 30-12/4.  CT of head negative for acute findings. MRI on 12/7 showing Motion degraded examination with no acute ischemia and Severe chronic small vessel disease and atrophy.     PT Comments    Pt is making some progress with mobility, she minimally able to be cued and follow these cues. She was able to get to EOB w/ SBA - min guard and cues, able to stand with min-mod a and HHA and able to take some few steps at EOB with mod a and HHA. Overall functionally pt is making progress with mobility, cognition is major limitation at this time.    Follow Up Recommendations  CIR     Equipment Recommendations  3in1 (PT);Rolling walker with 5" wheels    Recommendations for Other Services       Precautions / Restrictions Precautions Precautions: Fall Precaution Comments: states Lord help me over and over Restrictions Weight Bearing Restrictions: No    Mobility  Bed Mobility Overal bed mobility: Needs Assistance Bed Mobility: Supine to Sit;Sit to Supine     Supine to sit: Min assist Sit to supine: Min guard      Transfers Overall transfer level: Needs assistance Equipment used: 1 person hand held assist Transfers: Sit to/from Stand Sit to Stand: Min assist            Ambulation/Gait             General Gait  Details: able to take few steps at EOB with HHA and mod a   Stairs             Wheelchair Mobility    Modified Rankin (Stroke Patients Only)       Balance Overall balance assessment: Needs assistance Sitting-balance support: Feet supported;No upper extremity supported Sitting balance-Leahy Scale: Good     Standing balance support: During functional activity;Bilateral upper extremity supported Standing balance-Leahy Scale: Poor                              Cognition Arousal/Alertness: Awake/alert Behavior During Therapy: Restless;Anxious Overall Cognitive Status: Impaired/Different from baseline Area of Impairment: Orientation;Memory;Attention;Following commands;Safety/judgement;Awareness;Problem solving                 Orientation Level: Disoriented to;Place;Situation;Time Current Attention Level: Sustained Memory: Decreased short-term memory Following Commands: Follows one step commands consistently;Follows one step commands with increased time Safety/Judgement: Decreased awareness of safety;Decreased awareness of deficits Awareness: Intellectual Problem Solving: Difficulty sequencing;Requires verbal cues;Requires tactile cues        Exercises      General Comments        Pertinent Vitals/Pain Pain Assessment: Faces Faces Pain Scale: Hurts little more Pain Location: Generalized Pain Descriptors / Indicators: Discomfort    Home Living  Prior Function            PT Goals (current goals can now be found in the care plan section) Acute Rehab PT Goals Patient Stated Goal: unable to state PT Goal Formulation: Patient unable to participate in goal setting Time For Goal Achievement: 10/09/19 Potential to Achieve Goals: Good Progress towards PT goals: Progressing toward goals    Frequency    Min 4X/week      PT Plan Current plan remains appropriate    Co-evaluation              AM-PAC PT  "6 Clicks" Mobility   Outcome Measure  Help needed turning from your back to your side while in a flat bed without using bedrails?: A Little Help needed moving from lying on your back to sitting on the side of a flat bed without using bedrails?: A Little Help needed moving to and from a bed to a chair (including a wheelchair)?: A Lot Help needed standing up from a chair using your arms (e.g., wheelchair or bedside chair)?: A Lot Help needed to walk in hospital room?: A Lot Help needed climbing 3-5 steps with a railing? : Total 6 Click Score: 13    End of Session Equipment Utilized During Treatment: Oxygen Activity Tolerance: Patient tolerated treatment well Patient left: in bed;with call bell/phone within reach;with bed alarm set Nurse Communication: Mobility status PT Visit Diagnosis: Unsteadiness on feet (R26.81);Muscle weakness (generalized) (M62.81);Difficulty in walking, not elsewhere classified (R26.2);Other symptoms and signs involving the nervous system (R29.898)     Time: 3085-6943 PT Time Calculation (min) (ACUTE ONLY): 23 min  Charges:  $Therapeutic Activity: 23-37 mins                     Horald Chestnut, PT    Delford Field 09/27/2019, 4:56 PM

## 2019-09-27 NOTE — Progress Notes (Signed)
PROGRESS NOTE    Emmersyn Anctil  [id number] DOB: 11/24/1940 DOA: 09/20/2019 PCP: System, Pcp Not In    Brief Narrative:  78 year old female who presented with dyspnea.  She was admitted to [facility] November 21 in the setting of mild hypoxemia and diarrhea.  Her COVID-19 test came back positive November 22.  Patient developed progressive hypoxic respiratory failure despite 10 days of dexamethasone and 5 days of remdesivir. She was intubated November 30, transferred to [facility] December 2.  Patient received mechanical ventilation per ARDS-net protocol, with good toleration.  Patient was liberated from mechanical ventilation on December 4.   Her hospitalization was been complicated with bacterial pneumonia  coinfection requiring antibiotic therapy, acute focal neurologic deficit with slurred speech and facial droop (negative MRI for acute CVA ) and acute on chronic systolic heart failure.    Assessment & Plan:  Acute hypoxic respiratory failure due to SARS COVID-19 viral pneumonia/secondary bacterial infection Patient completed treatment with remdesivir and steroids.  She also completed a course of antibacterials.  Continues to require high doses of oxygen.  Continue with incentive spirometry, mobilization, bronchodilators.  PT and OT.  Her  encephalopathy is also contributing.  Acute metabolic encephalopathy Baseline mental status is not known.  She appears to be slightly delirious.  Could be due to acute illness and hospital stay.  No obvious focal neurological deficits noted.  She had a brain MRI as discussed below which did not show any acute findings.  Suspected TIA Apparently there was some concern for slurred speech and facial droop a few days ago.  Patient underwent MRI of the brain which did not show any acute findings.  However it was a motion degraded study.  Does not seem to have any focal deficits currently.  Continue with aspirin and statin.  PT and  OT.  Dysphagia 3 diet.  Aspiration precautions.    Essential hypertension/chronic systolic CHF (EF [id number]) Looks like she was a negative fluid balance in the ICU.  However ins and outs not charted appropriately on the floor.  We will give her furosemide.  Continue with beta-blocker.  Continue with statin.  Not noted to be on any diuretics at home.    Dyslipidemia Continue with statin therapy.   History of PE/DVT Continue with anticoagulation with enoxaparin for now.  Noted to be on rivaroxaban at home.  Could be switched over to her home medication soon.  History of coronary artery disease Stable.  No chest pain.  Continue with home medications.   Severe peripheral vascular disease and carotid artery disease Continue with statin and asa and cilostazol. Follow up as outpatient.   Diabetes mellitus type 2  Noted to have low normal glucose levels along with few episodes of hypoglycemia.  HbA1c 6.1.  Discontinue sliding scale coverage.  Continue to monitor CBGs.   CKD stage 3b.  Renal function is stable.  Monitor while she is getting diuresed.  Avoid nephrotoxic agents.  Monitor urine output.    Hypokalemia Replete potassium.  Check magnesium in the morning.   DVT prophylaxis:   Code Status: Limited Code Family Communication: We will discuss with her family later today Disposition Plan/ discharge barriers: Wait for improvement.  PT and OT evaluation.  CIR has been recommended.   Consultants:  Neurology   Procedures:   Transthoracic echocardiogram 09/20/2019 IMPRESSIONS    1. Technically difficult study. Left ventricular ejection fraction, by visual estimation, is 35 to 40%. The left ventricle has moderately decreased function. There is  mildly increased left ventricular hypertrophy. Inferior/septal akinesis  2. Left ventricular diastolic parameters are consistent with Grade I diastolic dysfunction (impaired relaxation).  3. Global right ventricle has normal systolic function. The  right ventricular size is normal.  4. Left atrial size was normal.  5. Right atrial size was not well visualized.  6. The mitral valve is normal in structure. Trace mitral valve regurgitation.  7. The tricuspid valve is not well visualized. Tricuspid valve regurgitation is trivial.  8. The aortic valve was not well visualized. Aortic valve regurgitation is mild. No evidence of aortic valve stenosis.  9. The pulmonic valve was not well visualized. Pulmonic valve regurgitation is not visualized.   Antimicrobials:  Anti-infectives (From admission, onward)   Start     Dose/Rate Route Frequency Ordered Stop   09/21/19 1100  ceFEPIme (MAXIPIME) 2 g in sodium chloride 0.9 % 100 mL IVPB     2 g 200 mL/hr over 30 Minutes Intravenous Every 12 hours 09/21/19 1022 09/26/19 0118        Subjective: Patient noted to be somewhat delirious at times.  No complaints offered.  Not very communicative.  Keeps yelling incomprehensibly.  Objective: Vitals:   09/27/19 0330 09/27/19 0447 09/27/19 0500 09/27/19 0808  BP: 128/63  (!) 132/57 113/69  Pulse: (!) 104  100 (!) 121  Resp: 19  20 19   Temp: 99.2 F (37.3 C)   98.9 F (37.2 C)  TempSrc: Oral   Oral  SpO2: 95%  95% 91%  Weight:  57.5 kg    Height:        Intake/Output Summary (Last 24 hours) at 09/27/2019 1149 Last data filed at 09/26/2019 1902 Gross per 24 hour  Intake 240 ml  Output -  Net 240 ml   Filed Weights   09/25/19 0124 09/26/19 0500 09/27/19 0447  Weight: 72.5 kg 74 kg 57.5 kg    Examination:   General appearance: Disoriented.  No obvious focal deficits.  In no distress. Resp: Coarse breath sounds bilaterally.  No wheezing or rhonchi.  Crackles at the bases. Cardio: S1-S2 is noted to be tachycardic regular.  No S3-S4. GI: Abdomen is soft.  Nontender nondistended.  Bowel sounds are present normal.  No masses organomegaly Extremities: No edema.  Noted to be moving all her extremities. Neurologic: Disoriented.  No obvious  focal deficits noted.      Data Reviewed: I have personally reviewed following labs and imaging studies  CBC: Recent Labs  Lab 09/21/19 0400 09/22/19 0345 09/22/19 1032 09/23/19 0435 09/24/19 0400 09/25/19 0130  WBC 8.3 10.5  --  10.4 12.7* 7.7  NEUTROABS 7.4 9.4*  --  8.9* 11.5* 6.3  HGB 13.1 12.7 12.2 13.8 13.3 11.9*  HCT 41.5 40.7 36.0 43.2 41.6 37.2  MCV 95.8 96.0  --  94.5 94.1 93.9  PLT 197 201  --  230 263 [id number]   Basic Metabolic Panel: Recent Labs  Lab 09/21/19 0400 09/21/19 1630 09/22/19 0345 09/22/19 1032 09/23/19 0435 09/24/19 0400 09/25/19 0130 09/27/19 0210  NA 138  --  141 141 148* 145 140 136  K 3.6  --  3.7 3.7 4.3 3.6 3.7 3.4*  CL 99  --  97*  --  96* 96* 95* 96*  CO2 29  --  33*  --  37* 34* 33* 25  GLUCOSE 124*  --  180*  --  118* 136* 109* 105*  BUN 65*  --  67*  --  70* 67* 64* 39*  CREATININE 1.14*  --  1.29*  --  1.30* 1.31* 1.19* 1.11*  CALCIUM 8.6*  --  9.0  --  9.6 9.8 9.4 9.2  MG 2.1 1.9 2.0  --  2.2 2.2  --   --   PHOS 4.1 3.5 3.7  --  2.7 3.8  --   --    GFR: Estimated Creatinine Clearance: 36.7 mL/min (A) (by C-G formula based on SCr of 1.11 mg/dL (H)). Liver Function Tests: Recent Labs  Lab 09/21/19 0400 09/22/19 0345 09/23/19 0435 09/24/19 0400 09/25/19 0130  AST 16 21 27 30 24   ALT 15 18 24 25 22   ALKPHOS 48 50 58 57 54  BILITOT 0.7 0.8 0.6 1.1 0.8  PROT 6.0* 5.9* 6.9 6.8 6.2*  ALBUMIN 2.6* 2.3* 2.6* 2.6* 2.2*   CBG: Recent Labs  Lab 09/26/19 1339 09/26/19 1641 09/26/19 1946 09/27/19 0010 09/27/19 0327  GLUCAP 71 115* 114* 76 93   Lipid Profile: Recent Labs    09/25/19 0130  CHOL 123  HDL 31*  LDLCALC 69  TRIG 114  CHOLHDL 4.0   Anemia Panel: Recent Labs    09/25/19 0130  FERRITIN 40*      Radiology Studies: I have reviewed all of the imaging during this hospital visit personally     Scheduled Meds: . aspirin EC  81 mg Oral Daily  . Chlorhexidine Gluconate Cloth  6 each Topical Daily   . cilostazol  50 mg Oral BID  . enoxaparin (LOVENOX) injection  70 mg Subcutaneous Q12H  . feeding supplement (ENSURE ENLIVE)  237 mL Oral BID BM  . insulin aspart  0-6 Units Subcutaneous Q4H  . mouth rinse  15 mL Mouth Rinse BID  . metoprolol tartrate  25 mg Per Tube BID  . multivitamin with minerals  1 tablet Oral Daily  . senna-docusate  3 tablet Oral BID  . simvastatin  20 mg Oral QHS  . traZODone  50 mg Oral QHS  . vitamin C  500 mg Per Tube Daily  . zinc sulfate  220 mg Per Tube Daily   Continuous Infusions:    LOS: 7 days        Bonnielee Haff, MD Pager: [email or web address]

## 2019-09-27 NOTE — Progress Notes (Signed)
Occupational Therapy Treatment Patient Details Name: Lakendra Helling MRN: [id number] DOB: [birth year] Today's Date: 09/27/2019    History of present illness 78 year old femalewith a history of HTN, severe PAD (Pletal) s/p bilateral iliac and femoral stenting, carotid stenosis (complete L occlusion, 50% R), unprovoked DVT/PE (Xarelto indefinitely), CKD III, h/o BRCA s/p mastectomy and chemo, former tobacco use (quit 2008).  initially admitted to [facility] on November 21 in the setting of mild hypoxemia and diarrhea.  Covid test became positive on November 22.  Treated with Decadron for 10 days, remdesivir 5 days.  Hypoxemia progressed.  Intubated on November 30-12/4.  CT of head negative for acute findings. MRI on 12/7 showing Motion degraded examination with no acute ischemia and Severe chronic small vessel disease and atrophy.    OT comments  Pt progressing towards established OT goals. Continues to present with decreased strength, cognition, and activity tolerance. Despite fatigue, pt agreeable to therapy and tolerating session. Pt performing grooming tasks at EOB with Min Guard A and Min-Max cues. Pt performing stand pivot transfer x2 with Mod A. Continue to recommend dc to CIR and will continue to follow acutely as admitted.   HR ~125 for majority of session; elevating to 140s after second transfer. SpO2 flucuating between 96-87% on 15L via HFNC. RR 20-30s. BP 137/86   Follow Up Recommendations  CIR;Supervision/Assistance - 24 hour    Equipment Recommendations  Other (comment)(Defer to next venue)    Recommendations for Other Services PT consult    Precautions / Restrictions Precautions Precautions: Fall Precaution Comments: unable to express her needs Restrictions Weight Bearing Restrictions: No       Mobility Bed Mobility Overal bed mobility: Needs Assistance Bed Mobility: Supine to Sit;Sit to Supine     Supine to sit: Min guard;HOB elevated Sit to supine: Min  guard   General bed mobility comments: Min Guard A for safety. Use of bed rails. No physical A needed. Requiring increased cues and time  Transfers Overall transfer level: Needs assistance Equipment used: Rolling walker (2 wheeled);1 person hand held assist Transfers: Sit to/from [facility] Sit to Stand: Mod assist Stand pivot transfers: Mod assist       General transfer comment: Pt requiring Mod A to power up into standing and then gain balance in standing. Pt using RW to stability in transfer to recliner; requiring physical A for management of RW. Once in recliner, pt stating "help me", grabbing OT's hand, and attempting to stand. Pt requiring Mod A to power up and then stabilitying wiht two hand held A and transfering back to bed per her request.    Balance Overall balance assessment: Needs assistance Sitting-balance support: Feet supported;No upper extremity supported Sitting balance-Leahy Scale: Good     Standing balance support: Bilateral upper extremity supported Standing balance-Leahy Scale: Poor Standing balance comment: reliant on physical assist to stand and transfer                           ADL either performed or assessed with clinical judgement   ADL Overall ADL's : Needs assistance/impaired     Grooming: Brushing hair;Wash/dry face;Min guard;Sitting Grooming Details (indicate cue type and reason): Min Guard A for safety and decreased awareness/cognition. Pt able to follow simple commands and sustain attempt to grooming tasks both bruhsing her hair and washing her face. Max cues during oral care and pt presenting with decreased sequencing and problem solving; see congition section  Toilet Transfer: Moderate assistance;Stand-pivot;RW(simulated to recliner; with and without RW) Toilet Transfer Details (indicate cue type and reason): Pt requiring Mod A to power up into standing and then gain balance in standing. Pt using  RW to stability in transfer to recliner; requiring physical A for management of RW. Once in recliner, pt stating "help me", grabbing OT's hand, and attempting to stand. Pt requiring Mod A to power up and then stabilitying wiht two hand held A and transfering back to bed per her request.         Functional mobility during ADLs: Moderate assistance(stand pivot) General ADL Comments: Pt agreeable to therapy and seemed to enjoy engaging in activities and conversation despite expressive difficulties.      Vision       Perception     Praxis      Cognition Arousal/Alertness: Awake/alert Behavior During Therapy: Restless;Anxious Overall Cognitive Status: Impaired/Different from baseline Area of Impairment: Orientation;Memory;Attention;Following commands;Safety/judgement;Awareness;Problem solving                 Orientation Level: Disoriented to;Place;Situation;Time Current Attention Level: Sustained Memory: Decreased short-term memory Following Commands: Follows one step commands consistently;Follows one step commands with increased time Safety/Judgement: Decreased awareness of safety;Decreased awareness of deficits Awareness: Intellectual Problem Solving: Difficulty sequencing;Requires verbal cues;Requires tactile cues General Comments: Pt repeating phrases such as "yes, ma'am" "help me" "yes, please" in responce to conversation questions. When talking about her sister, pt able to state "I have a sister" but was unable to verablize further details such as her name or if she is Nurse, adult. Pt following simple commands during groomign tasks and mobility. Once seated in recliner, pt with concerned look on her face and repeated "help me" and held onto OT's arm. Pt then standing and answering "yes please" when asked if she would like to return to bed. Pt occasionally smiling at OT in response to question or an action; thanking OT during session. During oral care, pt first placing tooth  brush in mouth and then ignoring tooth paste. Max cues to take top of tooth paste. Pt then putting tooth paste in mouth despite cues to put on tooth brush. Pt then rubbing tooth paste with tongue and requires Max cues ot use tooth brush. However, pt only requring Min cues for washing her face and using water cup to rinse and spit during oral care        Exercises     Shoulder Instructions       General Comments HR ~125 for majority of session; elevating to 140s after second transfer. SpO2 flucuating between 96-87% on 15L via HFNC. RR 20-30s. BP 137/86    Pertinent Vitals/ Pain       Pain Assessment: Faces Faces Pain Scale: Hurts a little bit Pain Location: Generalized Pain Descriptors / Indicators: Discomfort Pain Intervention(s): Monitored during session;Repositioned  Home Living                                          Prior Functioning/Environment              Frequency  Min 2X/week        Progress Toward Goals  OT Goals(current goals can now be found in the care plan section)  Progress towards OT goals: Progressing toward goals  Acute Rehab OT Goals Patient Stated Goal: Unstated OT Goal Formulation: With patient Time For Goal Achievement: 10/07/19 Potential  to Achieve Goals: Good ADL Goals Pt Will Perform Grooming: with set-up;with supervision;sitting Pt Will Perform Lower Body Dressing: with min assist;sit to/from stand Pt Will Transfer to Toilet: with min guard assist;bedside commode;ambulating Pt Will Perform Toileting - Clothing Manipulation and hygiene: with min guard assist;sit to/from stand;sitting/lateral leans Additional ADL Goal #1: Pt will demonstrate sustained attention to ADL with 2-3 cues Additional ADL Goal #2: Pt will follow one step commands for ADLs with Min cues  Plan Discharge plan remains appropriate    Co-evaluation                 AM-PAC OT "6 Clicks" Daily Activity     Outcome Measure   Help from another  person eating meals?: A Little Help from another person taking care of personal grooming?: A Little Help from another person toileting, which includes using toliet, bedpan, or urinal?: A Lot Help from another person bathing (including washing, rinsing, drying)?: A Lot Help from another person to put on and taking off regular upper body clothing?: A Little Help from another person to put on and taking off regular lower body clothing?: A Lot 6 Click Score: 15    End of Session Equipment Utilized During Treatment: Oxygen(15L)  OT Visit Diagnosis: Unsteadiness on feet (R26.81);Other abnormalities of gait and mobility (R26.89);Muscle weakness (generalized) (M62.81)   Activity Tolerance Patient tolerated treatment well   Patient Left with call bell/phone within reach;in bed;with bed alarm set   Nurse Communication Mobility status        Time: 0947-0962 OT Time Calculation (min): 39 min  Charges: OT General Charges $OT Visit: 1 Visit OT Treatments $Self Care/Home Management : 38-52 mins  [place], OTR/L Acute Rehab Pager: [phone] Office: [place] 09/27/2019, 11:16 AM

## 2019-09-28 ENCOUNTER — Inpatient Hospital Stay (HOSPITAL_COMMUNITY): Payer: Medicare Other

## 2019-09-28 LAB — COMPREHENSIVE METABOLIC PANEL
ALT: 28 U/L (ref 0–44)
AST: 30 U/L (ref 15–41)
Albumin: 2.3 g/dL — ABNORMAL LOW (ref 3.5–5.0)
Alkaline Phosphatase: 57 U/L (ref 38–126)
Anion gap: 14 (ref 5–15)
BUN: 38 mg/dL — ABNORMAL HIGH (ref 8–23)
CO2: 27 mmol/L (ref 22–32)
Calcium: 9.5 mg/dL (ref 8.9–10.3)
Chloride: 97 mmol/L — ABNORMAL LOW (ref 98–111)
Creatinine, Ser: 1.21 mg/dL — ABNORMAL HIGH (ref 0.44–1.00)
GFR calc Af Amer: 49 mL/min — ABNORMAL LOW (ref >60)
GFR calc non Af Amer: 42 mL/min — ABNORMAL LOW (ref >60)
Glucose, Bld: 103 mg/dL — ABNORMAL HIGH (ref 70–99)
Potassium: 3.9 mmol/L (ref 3.5–5.1)
Sodium: 138 mmol/L (ref 135–145)
Total Bilirubin: 0.8 mg/dL (ref 0.3–1.2)
Total Protein: 6.3 g/dL — ABNORMAL LOW (ref 6.5–8.1)

## 2019-09-28 LAB — CBC
HCT: 34.1 % — ABNORMAL LOW (ref 36.0–46.0)
Hemoglobin: 10.9 g/dL — ABNORMAL LOW (ref 12.0–15.0)
MCH: 29.8 pg (ref 26.0–34.0)
MCHC: 32 g/dL (ref 30.0–36.0)
MCV: 93.2 fL (ref 80.0–100.0)
Platelets: 218 10*3/uL (ref 150–400)
RBC: 3.66 MIL/uL — ABNORMAL LOW (ref 3.87–5.11)
RDW: 14.3 % (ref 11.5–15.5)
WBC: 9 10*3/uL (ref 4.0–10.5)
nRBC: 0 % (ref 0.0–0.2)

## 2019-09-28 LAB — GLUCOSE, CAPILLARY
Glucose-Capillary: 104 mg/dL — ABNORMAL HIGH (ref 70–99)
Glucose-Capillary: 115 mg/dL — ABNORMAL HIGH (ref 70–99)
Glucose-Capillary: 73 mg/dL (ref 70–99)

## 2019-09-28 LAB — MAGNESIUM: Magnesium: 1.9 mg/dL (ref 1.7–2.4)

## 2019-09-28 MED ORDER — HALOPERIDOL LACTATE 5 MG/ML IJ SOLN
2.0000 mg | Freq: Once | INTRAMUSCULAR | Status: AC
  Administered 2019-09-28: 2 mg via INTRAVENOUS
  Filled 2019-09-28: qty 1

## 2019-09-28 MED ORDER — POTASSIUM CHLORIDE CRYS ER 20 MEQ PO TBCR
40.0000 meq | EXTENDED_RELEASE_TABLET | Freq: Once | ORAL | Status: AC
  Administered 2019-09-28: 40 meq via ORAL
  Filled 2019-09-28: qty 2

## 2019-09-28 MED ORDER — FUROSEMIDE 10 MG/ML IJ SOLN
40.0000 mg | Freq: Once | INTRAMUSCULAR | Status: AC
  Administered 2019-09-28: 40 mg via INTRAVENOUS
  Filled 2019-09-28: qty 4

## 2019-09-28 MED ORDER — RIVAROXABAN 10 MG PO TABS
10.0000 mg | ORAL_TABLET | Freq: Every day | ORAL | Status: DC
  Administered 2019-09-29 – 2019-10-07 (×9): 10 mg via ORAL
  Filled 2019-09-28 (×10): qty 1

## 2019-09-28 MED ORDER — QUETIAPINE FUMARATE 25 MG PO TABS
25.0000 mg | ORAL_TABLET | Freq: Every day | ORAL | Status: DC
  Administered 2019-09-28: 25 mg via ORAL
  Filled 2019-09-28: qty 1

## 2019-09-28 NOTE — Progress Notes (Addendum)
Patient tolerating O2 weaning throughout shift well. 0700 15L HFNC 1345 4L [place]; worked with PT, no significant desaturations throughout.  1430 2L [place].  O2 sats > 88 even with changes in position and activity. Patient has also done well with incentive spirometry; done with this RN every 1-2 hours.

## 2019-09-28 NOTE — Progress Notes (Signed)
Physical Therapy Treatment Patient Details Name: Christie Harper MRN: [id number] DOB: [birth year] Today's Date: 09/28/2019    History of Present Illness 78 year old femalewith a history of HTN, severe PAD (Pletal) s/p bilateral iliac and femoral stenting, carotid stenosis (complete L occlusion, 50% R), unprovoked DVT/PE (Xarelto indefinitely), CKD III, h/o BRCA s/p mastectomy and chemo, former tobacco use (quit 2008).  initially admitted to [facility] on November 21 in the setting of mild hypoxemia and diarrhea.  Covid test became positive on November 22.  Treated with Decadron for 10 days, remdesivir 5 days.  Hypoxemia progressed.  Intubated on November 30-12/4.  CT of head negative for acute findings. MRI on 12/7 showing Motion degraded examination with no acute ischemia and Severe chronic small vessel disease and atrophy.     PT Comments    Pt seems to be in poor mood this pm, did not wish to attempt much mobility even with education on purpose and advantages of moving about or getting in to recliner. Pt was initially on 4L/min via HFNC and was satting in 90s, nurse titrated this to 2L/min and at rest pt still in 90s, pt was able to get to partial EOB and sats did briefly drop to 87% but immediately increased back to 90s again. Pt declined to attempt any further mobility from here. IN bed with bed set up and trended pt able to scoot herself into position with max encouragement. Will continue to follow acutely and encourage to participate in as much skilled mobility as able. D/c plan remains intact.     Follow Up Recommendations  CIR     Equipment Recommendations  3in1 (PT);Rolling walker with 5" wheels    Recommendations for Other Services       Precautions / Restrictions Precautions Precautions: Fall Restrictions Weight Bearing Restrictions: No    Mobility  Bed Mobility Overal bed mobility: Needs Assistance Bed Mobility: Supine to Sit;Sit to Supine     Supine to sit:  Supervision Sit to supine: Supervision   General bed mobility comments: today bed mob is SBA and cues with bed trended to scoot up, HOB elevated to sit up  Transfers                 General transfer comment: declined to attempt sitting EOB or in recliner  Ambulation/Gait             General Gait Details: did not attempt today    Stairs             Wheelchair Mobility    Modified Rankin (Stroke Patients Only)       Balance Overall balance assessment: Mild deficits observed, not formally tested                                          Cognition Arousal/Alertness: Awake/alert Behavior During Therapy: Agitated;Anxious;Restless Overall Cognitive Status: Impaired/Different from baseline Area of Impairment: Orientation;Memory;Attention;Following commands;Safety/judgement;Awareness;Problem solving                 Orientation Level: Disoriented to;Place;Situation;Time Current Attention Level: Sustained Memory: Decreased short-term memory Following Commands: Follows one step commands consistently;Follows one step commands with increased time Safety/Judgement: Decreased awareness of safety;Decreased awareness of deficits Awareness: Intellectual Problem Solving: Difficulty sequencing;Requires verbal cues;Requires tactile cues General Comments: pt is in bad mood and is quite fiesty this pm, will only attempt minimal activity w/ therapits, nurse is in  room throughout      Exercises      General Comments General comments (skin integrity, edema, etc.): initially on 4l/min at rest placed on 2l/min for activity pt went to EOB and sats decreased to 87% briefly then recovered to 90s again      Pertinent Vitals/Pain Pain Assessment: No/denies pain    Home Living                      Prior Function            PT Goals (current goals can now be found in the care plan section) Acute Rehab PT Goals Patient Stated Goal: no states  goal PT Goal Formulation: Patient unable to participate in goal setting Time For Goal Achievement: 10/09/19 Potential to Achieve Goals: Good Progress towards PT goals: Progressing toward goals    Frequency    Min 4X/week      PT Plan Current plan remains appropriate    Co-evaluation              AM-PAC PT "6 Clicks" Mobility   Outcome Measure  Help needed turning from your back to your side while in a flat bed without using bedrails?: None Help needed moving from lying on your back to sitting on the side of a flat bed without using bedrails?: A Little Help needed moving to and from a bed to a chair (including a wheelchair)?: A Lot Help needed standing up from a chair using your arms (e.g., wheelchair or bedside chair)?: A Lot Help needed to walk in hospital room?: A Lot Help needed climbing 3-5 steps with a railing? : Total 6 Click Score: 14    End of Session Equipment Utilized During Treatment: Oxygen Activity Tolerance: Treatment limited secondary to agitation Patient left: in bed;with call bell/phone within reach;with bed alarm set Nurse Communication: Mobility status PT Visit Diagnosis: Unsteadiness on feet (R26.81);Muscle weakness (generalized) (M62.81);Difficulty in walking, not elsewhere classified (R26.2);Other symptoms and signs involving the nervous system (R29.898)     Time: 0354-6568 PT Time Calculation (min) (ACUTE ONLY): 17 min  Charges:  $Therapeutic Activity: 8-22 mins                     Horald Chestnut, PT    Delford Field 09/28/2019, 4:02 PM

## 2019-09-28 NOTE — Progress Notes (Signed)
Updated pt's emergency contact Izora Gala) on plan of care and pt status. All questions answered at this time.

## 2019-09-28 NOTE — Progress Notes (Signed)
PROGRESS NOTE    Canada Orcutt  [id number] DOB: 25-Apr-1941 DOA: 09/20/2019 PCP: System, Pcp Not In    Brief Narrative:  78 year old female who presented with dyspnea.  She was admitted to [facility] November 21 in the setting of mild hypoxemia and diarrhea.  Her COVID-19 test came back positive November 22.  Patient developed progressive hypoxic respiratory failure despite 10 days of dexamethasone and 5 days of remdesivir. She was intubated November 30, transferred to [facility] December 2.  Patient received mechanical ventilation per ARDS-net protocol, with good toleration.  Patient was liberated from mechanical ventilation on December 4.   Her hospitalization was been complicated with bacterial pneumonia  coinfection requiring antibiotic therapy, acute focal neurologic deficit with slurred speech and facial droop (negative MRI for acute CVA ) and acute on chronic systolic heart failure.    Assessment & Plan:  Acute hypoxic respiratory failure due to SARS COVID-19 viral pneumonia/secondary bacterial infection Patient completed treatment with remdesivir and steroids.  She also completed a course of antibacterials.  However her oxygen requirements remain high.  She is noted to be on 15 L of high flow nasal cannula again this morning.  Discussed with nursing staff.  Will be weaned down gradually.  She was given furosemide yesterday.  Chest x-ray done this morning shows diffuse bilateral infiltrates, slightly worsened from before.  We will give her additional dose today.  Continue with incentive spirometry, mobilization and bronchodilators.  PT and OT.  CIR has been recommended.  Will consult rehab when patient is more stable.    Acute metabolic encephalopathy According to patient's sister patient is usually quite alert and oriented at baseline.  There is no history of dementia.  MRI brain did show brain atrophy.  She could have mild cognitive impairment which was not  evident.  Due to acute hospitalization and acute illness that this may have manifested.  Mentation appears to be slightly better today.  She is more oriented.  Continue to monitor.  She does not have any focal neurological deficits at this time.    Suspected TIA Apparently there was some concern for slurred speech and facial droop a few days ago.  Patient underwent MRI of the brain which did not show any acute findings.  However it was a motion degraded study.  No focal deficits present.  Continue aspirin and statin.  Dysphagia diet.  Aspiration precautions.    Essential hypertension/chronic systolic CHF ([id number]) Looks like she was a negative fluid balance in the ICU.  However ins and outs not charted appropriately on the floor.  Stop furosemide today.  Continue with beta-blocker and statin.  Not noted to be on any diuretics at home.    Dyslipidemia Continue with statin therapy.   History of PE/DVT Continue with anticoagulation with enoxaparin for now.  Noted to be on rivaroxaban at home.  Changed over to rivaroxaban today.  History of coronary artery disease Stable.  No chest pain.  Continue with home medications.   Severe peripheral vascular disease and carotid artery disease Continue with statin and asa and cilostazol. Follow up as outpatient.   Diabetes mellitus type 2  Due to low normal glucose levels sliding scale coverage was discontinued.  HbA1c 6.1.  Continue to monitor.    CKD stage 3b.  Renal function is stable.  Monitor while she is getting diuresed.  Avoid nephrotoxic agents.  Monitor urine output.    Hypokalemia Potassium 3.9 today.  Magnesium 1.9.  Normocytic anemia No evidence  of overt bleeding. Continue to monitor.   DVT prophylaxis: Changed to rivaroxaban today. Code Status: Limited Code Family Communication: Discussed with her sister yesterday Disposition Plan/ discharge barriers: Await further improvement in respiratory status.  PT has recommended CIR.      Consultants:  Neurology   Procedures:   Transthoracic echocardiogram 09/20/2019 IMPRESSIONS    1. Technically difficult study. Left ventricular ejection fraction, by visual estimation, is 35 to 40%. The left ventricle has moderately decreased function. There is mildly increased left ventricular hypertrophy. Inferior/septal akinesis  2. Left ventricular diastolic parameters are consistent with Grade I diastolic dysfunction (impaired relaxation).  3. Global right ventricle has normal systolic function. The right ventricular size is normal.  4. Left atrial size was normal.  5. Right atrial size was not well visualized.  6. The mitral valve is normal in structure. Trace mitral valve regurgitation.  7. The tricuspid valve is not well visualized. Tricuspid valve regurgitation is trivial.  8. The aortic valve was not well visualized. Aortic valve regurgitation is mild. No evidence of aortic valve stenosis.  9. The pulmonic valve was not well visualized. Pulmonic valve regurgitation is not visualized.   Antimicrobials:  Anti-infectives (From admission, onward)   Start     Dose/Rate Route Frequency Ordered Stop   09/21/19 1100  ceFEPIme (MAXIPIME) 2 g in sodium chloride 0.9 % 100 mL IVPB     2 g 200 mL/hr over 30 Minutes Intravenous Every 12 hours 09/21/19 1022 09/26/19 0118        Subjective: Noted to be more oriented today.  Denies any complaints.  Still somewhat distracted.  Objective: Vitals:   09/28/19 0419 09/28/19 0820 09/28/19 0900 09/28/19 1000  BP: 113/67 135/64  (!) 148/52  Pulse: 95 (!) 102 94 85  Resp: 19 18 18 20   Temp: 98.3 F (36.8 C)     TempSrc: Axillary     SpO2: 98% 91% 94% 97%  Weight: 70.8 kg     Height:        Intake/Output Summary (Last 24 hours) at 09/28/2019 1136 Last data filed at 09/28/2019 0800 Gross per 24 hour  Intake 240 ml  Output -  Net 240 ml   Filed Weights   09/26/19 0500 09/27/19 0447 09/28/19 0419  Weight: 74 kg 57.5 kg 70.8 kg     Examination:   General appearance: More awake and alert today compared to yesterday.  Still distracted.  In no distress.   Resp: Mildly tachypneic at rest.  Coarse breath sound bilaterally.  Crackles at the bases.  No wheezing or rhonchi.   Cardio: S1-S2 is normal regular.  No S3-S4.  No rubs murmurs or bruit GI: Abdomen is soft.  Nontender nondistended.  Bowel sounds are present normal.  No masses organomegaly Extremities: No edema.  Moving all her extremities. Neurologic: Awake alert.  Oriented to place, city, month and the name of the president.  No facial asymmetry.  Equal motor strength bilateral upper and lower extremities.       Data Reviewed: I have personally reviewed following labs and imaging studies  CBC: Recent Labs  Lab 09/22/19 0345 09/22/19 1032 09/23/19 0435 09/24/19 0400 09/25/19 0130 09/28/19 0015  WBC 10.5  --  10.4 12.7* 7.7 9.0  NEUTROABS 9.4*  --  8.9* 11.5* 6.3  --   HGB 12.7 12.2 13.8 13.3 11.9* 10.9*  HCT 40.7 36.0 43.2 41.6 37.2 34.1*  MCV 96.0  --  94.5 94.1 93.9 93.2  PLT 201  --  230 263 232 [id number]   Basic Metabolic Panel: Recent Labs  Lab 09/21/19 1630 09/22/19 0345 09/23/19 0435 09/24/19 0400 09/25/19 0130 09/27/19 0210 09/28/19 0015  NA  --  141 148* 145 140 136 138  K  --  3.7 4.3 3.6 3.7 3.4* 3.9  CL  --  97* 96* 96* 95* 96* 97*  CO2  --  33* 37* 34* 33* 25 27  GLUCOSE  --  180* 118* 136* 109* 105* 103*  BUN  --  67* 70* 67* 64* 39* 38*  CREATININE  --  1.29* 1.30* 1.31* 1.19* 1.11* 1.21*  CALCIUM  --  9.0 9.6 9.8 9.4 9.2 9.5  MG 1.9 2.0 2.2 2.2  --   --  1.9  PHOS 3.5 3.7 2.7 3.8  --   --   --    GFR: Estimated Creatinine Clearance: 36.1 mL/min (A) (by C-G formula based on SCr of 1.21 mg/dL (H)). Liver Function Tests: Recent Labs  Lab 09/22/19 0345 09/23/19 0435 09/24/19 0400 09/25/19 0130 09/28/19 0015  AST 21 27 30 24 30   ALT 18 24 25 22 28   ALKPHOS 50 58 57 54 57  BILITOT 0.8 0.6 1.1 0.8 0.8  PROT 5.9* 6.9 6.8  6.2* 6.3*  ALBUMIN 2.3* 2.6* 2.6* 2.2* 2.3*   CBG: Recent Labs  Lab 09/27/19 0813 09/27/19 1202 09/27/19 1603 09/27/19 1954 09/28/19 0752  GLUCAP 83 121* 114* 102* 104*     Radiology Studies:     Scheduled Meds: . aspirin EC  81 mg Oral Daily  . Chlorhexidine Gluconate Cloth  6 each Topical Daily  . cilostazol  50 mg Oral BID  . DULoxetine  60 mg Oral BID  . enoxaparin (LOVENOX) injection  70 mg Subcutaneous Q12H  . feeding supplement (ENSURE ENLIVE)  237 mL Oral TID BM  . ferrous sulfate  325 mg Oral BID  . mouth rinse  15 mL Mouth Rinse BID  . metoprolol tartrate  25 mg Oral BID  . multivitamin with minerals  1 tablet Oral Daily  . senna-docusate  3 tablet Oral BID  . simvastatin  20 mg Oral QHS  . traZODone  50 mg Oral QHS  . vitamin C  500 mg Per Tube Daily  . zinc sulfate  220 mg Per Tube Daily   Continuous Infusions:    LOS: 8 days     Bonnielee Haff, MD Pager: [email or web address]

## 2019-09-29 LAB — COMPREHENSIVE METABOLIC PANEL
ALT: 26 U/L (ref 0–44)
AST: 27 U/L (ref 15–41)
Albumin: 2.3 g/dL — ABNORMAL LOW (ref 3.5–5.0)
Alkaline Phosphatase: 55 U/L (ref 38–126)
Anion gap: 17 — ABNORMAL HIGH (ref 5–15)
BUN: 41 mg/dL — ABNORMAL HIGH (ref 8–23)
CO2: 23 mmol/L (ref 22–32)
Calcium: 8.6 mg/dL — ABNORMAL LOW (ref 8.9–10.3)
Chloride: 98 mmol/L (ref 98–111)
Creatinine, Ser: 1.38 mg/dL — ABNORMAL HIGH (ref 0.44–1.00)
GFR calc Af Amer: 42 mL/min — ABNORMAL LOW (ref >60)
GFR calc non Af Amer: 36 mL/min — ABNORMAL LOW (ref >60)
Glucose, Bld: 105 mg/dL — ABNORMAL HIGH (ref 70–99)
Potassium: 3.2 mmol/L — ABNORMAL LOW (ref 3.5–5.1)
Sodium: 138 mmol/L (ref 135–145)
Total Bilirubin: 0.8 mg/dL (ref 0.3–1.2)
Total Protein: 5.9 g/dL — ABNORMAL LOW (ref 6.5–8.1)

## 2019-09-29 LAB — CBC
HCT: 33.3 % — ABNORMAL LOW (ref 36.0–46.0)
Hemoglobin: 10.6 g/dL — ABNORMAL LOW (ref 12.0–15.0)
MCH: 30.2 pg (ref 26.0–34.0)
MCHC: 31.8 g/dL (ref 30.0–36.0)
MCV: 94.9 fL (ref 80.0–100.0)
Platelets: 227 10*3/uL (ref 150–400)
RBC: 3.51 MIL/uL — ABNORMAL LOW (ref 3.87–5.11)
RDW: 14.5 % (ref 11.5–15.5)
WBC: 7.7 10*3/uL (ref 4.0–10.5)
nRBC: 0 % (ref 0.0–0.2)

## 2019-09-29 LAB — GLUCOSE, CAPILLARY
Glucose-Capillary: 126 mg/dL — ABNORMAL HIGH (ref 70–99)
Glucose-Capillary: 89 mg/dL (ref 70–99)
Glucose-Capillary: 93 mg/dL (ref 70–99)
Glucose-Capillary: 102 mg/dL — ABNORMAL HIGH (ref 70–99)
Glucose-Capillary: 98 mg/dL (ref 70–99)

## 2019-09-29 MED ORDER — POTASSIUM CHLORIDE CRYS ER 20 MEQ PO TBCR
40.0000 meq | EXTENDED_RELEASE_TABLET | Freq: Once | ORAL | Status: AC
  Administered 2019-09-29: 40 meq via ORAL
  Filled 2019-09-29: qty 2

## 2019-09-29 MED ORDER — QUETIAPINE FUMARATE 25 MG PO TABS
25.0000 mg | ORAL_TABLET | Freq: Two times a day (BID) | ORAL | Status: DC
  Administered 2019-09-29 – 2019-09-30 (×3): 25 mg via ORAL
  Filled 2019-09-29 (×3): qty 1

## 2019-09-29 NOTE — Progress Notes (Signed)
Physical Therapy Treatment Patient Details Name: Christie Harper MRN: [id number] DOB: 07-Apr-1941 Today's Date: 09/29/2019    History of Present Illness Pt is an 78 y.o. female initially admitted to [facility] on 09/09/19 with mild hypoxemia and diarrhea, (+) COVID-19 test 11/22, treated with Decadron for 10 days, remdesivir 5 days; hypoxemia progressed requiring ETT 11/30-12/4. Head CT negative for acute abnormality; MRI 12/7 with no acute ischemia and severe chronic small vessel disease. 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   PT Comments    Pt received attempting to get out of bed and frequently yells, "Lord help [place]!" Able to be redirected to situation and task, although flat affect and seems confused. Pt able to progress transfer and gait training with min-modA this session. Agreeable to participate and motivated to get out of bed. Pt with very poor balance strategies/postural reactions, unable to accept challenge without LOB; at high risk for falls. Continue to recommend intensive CIR-level therapies to maximize functional mobility and independence prior to return home.  SpO2 >/88% on RA   Follow Up Recommendations  CIR;Supervision/Assistance - 24 hour     Equipment Recommendations  3in1 (PT);Rolling walker with 5" wheels    Recommendations for Other Services Rehab consult     Precautions / Restrictions Precautions Precautions: Fall Precaution Comments: states "Lord help [place]" over and over Restrictions Weight Bearing Restrictions: No    Mobility  Bed Mobility Overal bed mobility: Needs Assistance Bed Mobility: Supine to Sit     Supine to sit: Supervision        Transfers Overall transfer level: Needs assistance Equipment used: 1 person hand held  assist;Rolling walker (2 wheeled) Transfers: Sit to/from Stand Sit to Stand: Mod assist;Min assist         General transfer comment: Initial modA for HHA to elevate trunk , cues to achieve fully upright posture; performed multiple standing trials from recliner, cues for correct hand placement, initial modA progressing to minA with repeated trials  Ambulation/Gait Ambulation/Gait assistance: Mod assist;Min assist Gait Distance (Feet): 8 Feet Assistive device: 2 person hand held assist;Rolling walker (2 wheeled) Gait Pattern/deviations: Step-to pattern;Trunk flexed;Leaning posteriorly Gait velocity: Decreased Gait velocity interpretation: <1.31 ft/sec, indicative of household ambulator General Gait Details: Steps to recliner, pt reliant on HHA and reaching to recliner for support of second hand, modA to prevent LOB; additional gait trial with RW, stability improved with BUE support but continues to have knee buckling and posterior lean requiring min-modA to maintain balance   Stairs             Wheelchair Mobility    Modified Rankin (Stroke Patients Only) Modified Rankin (Stroke Patients Only) Pre-Morbid Rankin Score: No symptoms Modified Rankin: Moderately severe disability     Balance Overall balance assessment: Needs assistance Sitting-balance support: Feet supported;No upper extremity supported Sitting balance-Leahy Scale: Fair     Standing balance support: During functional activity;Bilateral upper extremity supported Standing balance-Leahy Scale: Poor Standing balance comment: Reliant on UE support and external assist                            Cognition Arousal/Alertness: Awake/alert Behavior During Therapy: Flat affect;Restless Overall Cognitive Status: Impaired/Different from baseline Area of Impairment: Orientation;Memory;Attention;Following commands;Safety/judgement;Awareness;Problem solving  Orientation Level: Disoriented  to;Place;Situation;Time Current Attention Level: Sustained Memory: Decreased short-term memory Following Commands: Follows one step commands consistently;Follows one step commands with increased time Safety/Judgement: Decreased awareness of safety;Decreased awareness of deficits Awareness: Intellectual Problem Solving: Difficulty sequencing;Requires verbal cues;Requires tactile cues;Slow processing General Comments: Pt seen attempting to get out of bed, yells "Lord help [place]" but responds well to redirection; following majority of simple commands with increased time; flat affect throughout      Exercises      General Comments General comments (skin integrity, edema, etc.): Demonstrated good pull on IS although difficulty following cues to keep blue piece between arrows; safety sitter present and to continue practicing with pt. Safety sitter present to observe safe transfer technique      Pertinent Vitals/Pain Pain Assessment: No/denies pain    Home Living                      Prior Function            PT Goals (current goals can now be found in the care plan section) Acute Rehab PT Goals Patient Stated Goal: "[place] help [place]" Progress towards PT goals: Progressing toward goals    Frequency    Min 4X/week      PT Plan Current plan remains appropriate    Co-evaluation              AM-PAC PT "6 Clicks" Mobility   Outcome Measure  Help needed turning from your back to your side while in a flat bed without using bedrails?: None Help needed moving from lying on your back to sitting on the side of a flat bed without using bedrails?: None Help needed moving to and from a bed to a chair (including a wheelchair)?: A Lot Help needed standing up from a chair using your arms (e.g., wheelchair or bedside chair)?: A Lot Help needed to walk in hospital room?: A Lot Help needed climbing 3-5 steps with a railing? : Total 6 Click Score: 15    End of Session   Activity  Tolerance: Patient tolerated treatment well Patient left: in chair;with call bell/phone within reach;with chair alarm set;with nursing/sitter in room Nurse Communication: Mobility status PT Visit Diagnosis: Unsteadiness on feet (R26.81);Muscle weakness (generalized) (M62.81);Difficulty in walking, not elsewhere classified (R26.2);Other symptoms and signs involving the nervous system (R29.898)     Time: 3704-8889 PT Time Calculation (min) (ACUTE ONLY): 20 min  Charges:  $Therapeutic Activity: 8-22 mins                    Mabeline Caras, PT, DPT Acute Rehabilitation Services  Pager [phone] Office [place] 09/29/2019, 3:30 PM

## 2019-09-29 NOTE — Progress Notes (Signed)
  Speech Language Pathology Treatment: Dysphagia  Patient Details Name: Christie Harper MRN: [id number] DOB: 01-25-1941 Today's Date: 09/29/2019 Time: 9923-4144 SLP Time Calculation (min) (ACUTE ONLY): 13 min  Assessment / Plan / Recommendation Clinical Impression  Cognition has improved from Wed when this SLP last worked with pt. RN just gave her Haldol because just before I arrived due to yelling out every couple of minutes. Pt was oriented to place and situation (Covid) and attended to po trials without cueing. Thin water via straw with trials of cracker were adequate. RN stated he was going to give her Seroquel as well, therefore will keep texture Dys 3 as she may have periods of lethargy. From a cognitive standpoint, she remains encephalopathic but improving. ST will sign off at this time. If she continues with confusion at discharge would recommend further ST at next venue for cognition.    HPI HPI: 78 year old femalewith a history of HTN, severe PAD (Pletal) s/p bilateral iliac and femoral stenting, carotid stenosis (complete L occlusion, 50% R), unprovoked DVT/PE (Xarelto indefinitely), CKD III, h/o BRCA s/p mastectomy and chemo, former tobacco use (quit 2008).  initially admitted to [facility] on November 21 in the setting of mild hypoxemia and diarrhea.  Covid test became positive on November 22.  Treated with Decadron for 10 days, remdesivir 5 days.  Hypoxemia progressed.  Intubated on November 30-12/4.      SLP Plan  All goals met       Recommendations  Diet recommendations: Dysphagia 3 (mechanical soft);Thin liquid Liquids provided via: Cup;Straw Medication Administration: Whole meds with liquid Supervision: Patient able to self feed;Intermittent supervision to cue for compensatory strategies Compensations: Slow rate;Small sips/bites;Minimize environmental distractions                Oral Care Recommendations: Oral care BID SLP Visit Diagnosis: Dysphagia,  unspecified (R13.10);Cognitive communication deficit [phone]) Plan: All goals met       GO                Houston Siren 09/29/2019, 1:30 PM  Orbie Pyo Colvin Caroli.Ed Risk analyst [phone] Office [phone]

## 2019-09-29 NOTE — Progress Notes (Signed)
PROGRESS NOTE    Christie Harper  [id number] DOB: Jan 29, 1941 DOA: 09/20/2019 PCP: System, Pcp Not In    Brief Narrative:  78 year old female who presented with dyspnea.  She was admitted to [facility] November 21 in the setting of mild hypoxemia and diarrhea.  Her COVID-19 test came back positive November 22.  Patient developed progressive hypoxic respiratory failure despite 10 days of dexamethasone and 5 days of remdesivir. She was intubated November 30, transferred to [facility] December 2.  Patient received mechanical ventilation per ARDS-net protocol, with good toleration.  Patient was liberated from mechanical ventilation on December 4.   Her hospitalization was been complicated with bacterial pneumonia  coinfection requiring antibiotic therapy, acute focal neurologic deficit with slurred speech and facial droop (negative MRI for acute CVA ) and acute on chronic systolic heart failure.    Assessment & Plan:  Acute hypoxic respiratory failure due to SARS COVID-19 viral pneumonia/secondary bacterial infection Patient was initially intubated.  She was successfully extubated on December 4.  Patient completed treatment with remdesivir and steroids.  She also completed a course of antibacterials.   Patient has been on high doses of oxygen as high as 15 L of high flow nasal cannula.  Seems to have improved in the last 48 hours.  She was given Lasix the last couple of days.  Holding due to rising BUN and creatinine. Continue with incentive spirometry, mobilization and bronchodilators.  PT and OT.  CIR is recommended.  We will consult him.    Acute metabolic encephalopathy According to patient's sister patient is usually quite alert and oriented at baseline.  There is no history of dementia.  MRI brain did show brain atrophy.  She could have mild cognitive impairment which was not evident previously.  Due to acute hospitalization and acute illness that this may have  manifested.   He remains distracted and confused at times.  Exhibiting worsening confusion during the early morning hours and in the late evening hours.  Started on Seroquel.  Will consider increasing the dose.  May be changed to twice daily.  No focal deficits noted.    Suspected TIA Apparently there was some concern for slurred speech and facial droop a few days ago.  Patient underwent MRI of the brain which did not show any acute findings.  However it was a motion degraded study.  No focal deficits present.  Continue aspirin and statin.  Dysphagia diet.  Aspiration precautions.  Stable.  Essential hypertension/chronic systolic CHF ([id number]) Looks like she was a negative fluid balance in the ICU.  Continue with beta-blocker and statin.  Not noted to be on any diuretics at home.  No Lasix due to rising BUN and creatinine.  Monitor urine output.  Ins and outs and daily weights.  Dyslipidemia Continue with statin therapy.   History of PE/DVT Back on her rivaroxaban.    History of coronary artery disease Stable.  No chest pain.  Continue with home medications.   Severe peripheral vascular disease and carotid artery disease Continue with statin and asa and cilostazol. Follow up as outpatient.   Diabetes mellitus type 2  Due to low normal glucose levels sliding scale coverage was discontinued.  HbA1c 6.1.  CBGs are on the low side but stable.  Encourage oral intake.  CKD stage 3b.  Renal function is stable.  Monitor while she is getting diuresed.  Avoid nephrotoxic agents.  Monitor urine output.    Hypokalemia Potassium to be repleted today.  Magnesium was 1.9 yesterday.  Normocytic anemia No evidence of overt bleeding. Continue to monitor.  Hemoglobin is stable.   DVT prophylaxis: Rivaroxaban Code Status: Limited Code Family Communication: Sister being updated on a daily basis Disposition Plan: CIR to be consulted.     Consultants:  Neurology   Procedures:   Transthoracic  echocardiogram 09/20/2019 IMPRESSIONS    1. Technically difficult study. Left ventricular ejection fraction, by visual estimation, is 35 to 40%. The left ventricle has moderately decreased function. There is mildly increased left ventricular hypertrophy. Inferior/septal akinesis  2. Left ventricular diastolic parameters are consistent with Grade I diastolic dysfunction (impaired relaxation).  3. Global right ventricle has normal systolic function. The right ventricular size is normal.  4. Left atrial size was normal.  5. Right atrial size was not well visualized.  6. The mitral valve is normal in structure. Trace mitral valve regurgitation.  7. The tricuspid valve is not well visualized. Tricuspid valve regurgitation is trivial.  8. The aortic valve was not well visualized. Aortic valve regurgitation is mild. No evidence of aortic valve stenosis.  9. The pulmonic valve was not well visualized. Pulmonic valve regurgitation is not visualized.   Antimicrobials:  Anti-infectives (From admission, onward)   Start     Dose/Rate Route Frequency Ordered Stop   09/21/19 1100  ceFEPIme (MAXIPIME) 2 g in sodium chloride 0.9 % 100 mL IVPB     2 g 200 mL/hr over 30 Minutes Intravenous Every 12 hours 09/21/19 1022 09/26/19 0118        Subjective: At times patient does answer questions appropriately.  At other times she keeps yelling.  Does not appear to be in any distress.  Objective: Vitals:   09/29/19 0010 09/29/19 0020 09/29/19 0100 09/29/19 0500  BP: (!) 93/44 (!) 93/44 104/68 116/84  Pulse: (!) 104 96 95 98  Resp: (!) 27 19  18   Temp:  98.5 F (36.9 C)  98.1 F (36.7 C)  TempSrc:  Oral  Oral  SpO2: 97% 97%  97%  Weight:      Height:       No intake or output data in the 24 hours ending 09/29/19 1022 Filed Weights   09/26/19 0500 09/27/19 0447 09/28/19 0419  Weight: 74 kg 57.5 kg 70.8 kg    Examination:   General appearance: Awake alert.  Distracted.  Agitated at  times. Resp: Normal effort noted.  Improved air entry bilaterally.  Continues to have coarse breath sounds with crackles at the bases.  No wheezing or rhonchi.   Cardio: S1-S2 is normal regular.  No S3-S4.  No rubs murmurs or bruit GI: Abdomen is soft.  Nontender nondistended.  Bowel sounds are present normal.  No masses organomegaly Extremities: No edema.  Moving all her extremities. Neurologic: She is awake alert.  Oriented to place city month.  No obvious focal neurological deficits.     Data Reviewed: I have personally reviewed following labs and imaging studies  CBC: Recent Labs  Lab 09/23/19 0435 09/24/19 0400 09/25/19 0130 09/28/19 0015 09/29/19 0411  WBC 10.4 12.7* 7.7 9.0 7.7  NEUTROABS 8.9* 11.5* 6.3  --   --   HGB 13.8 13.3 11.9* 10.9* 10.6*  HCT 43.2 41.6 37.2 34.1* 33.3*  MCV 94.5 94.1 93.9 93.2 94.9  PLT 230 263 232 218 [id number]   Basic Metabolic Panel: Recent Labs  Lab 09/23/19 0435 09/24/19 0400 09/25/19 0130 09/27/19 0210 09/28/19 0015 09/29/19 0411  NA 148* 145 140 136 138 138  K 4.3 3.6 3.7 3.4* 3.9 3.2*  CL 96* 96* 95* 96* 97* 98  CO2 37* 34* 33* 25 27 23   GLUCOSE 118* 136* 109* 105* 103* 105*  BUN 70* 67* 64* 39* 38* 41*  CREATININE 1.30* 1.31* 1.19* 1.11* 1.21* 1.38*  CALCIUM 9.6 9.8 9.4 9.2 9.5 8.6*  MG 2.2 2.2  --   --  1.9  --   PHOS 2.7 3.8  --   --   --   --    GFR: Estimated Creatinine Clearance: 31.6 mL/min (A) (by C-G formula based on SCr of 1.38 mg/dL (H)). Liver Function Tests: Recent Labs  Lab 09/23/19 0435 09/24/19 0400 09/25/19 0130 09/28/19 0015 09/29/19 0411  AST 27 30 24 30 27   ALT 24 25 22 28 26   ALKPHOS 58 57 54 57 55  BILITOT 0.6 1.1 0.8 0.8 0.8  PROT 6.9 6.8 6.2* 6.3* 5.9*  ALBUMIN 2.6* 2.6* 2.2* 2.3* 2.3*   CBG: Recent Labs  Lab 09/28/19 0752 09/28/19 1134 09/28/19 1557 09/29/19 0010 09/29/19 0809  GLUCAP 104* 115* 73 126* 89     Radiology Studies:     Scheduled Meds: . aspirin EC  81 mg Oral Daily   . Chlorhexidine Gluconate Cloth  6 each Topical Daily  . cilostazol  50 mg Oral BID  . DULoxetine  60 mg Oral BID  . feeding supplement (ENSURE ENLIVE)  237 mL Oral TID BM  . ferrous sulfate  325 mg Oral BID  . mouth rinse  15 mL Mouth Rinse BID  . metoprolol tartrate  25 mg Oral BID  . multivitamin with minerals  1 tablet Oral Daily  . QUEtiapine  25 mg Oral QHS  . rivaroxaban  10 mg Oral Daily  . senna-docusate  3 tablet Oral BID  . simvastatin  20 mg Oral QHS  . traZODone  50 mg Oral QHS  . vitamin C  500 mg Per Tube Daily  . zinc sulfate  220 mg Per Tube Daily   Continuous Infusions:    LOS: 9 days     Bonnielee Haff, MD Pager: [email or web address]

## 2019-09-29 NOTE — Progress Notes (Signed)
Inpatient Rehab Admissions Coordinator:   At this time, we are recommending a rehab consult. Please place an order for pt to be evaluated for CIR if she is agreeable.    Shann Medal, PT, DPT Admissions Coordinator [phone] 09/29/19  9:54 AM

## 2019-09-29 NOTE — Plan of Care (Signed)
  Problem: Elimination: Goal: Will not experience complications related to bowel motility Outcome: Progressing Goal: Will not experience complications related to urinary retention Outcome: Progressing   

## 2019-09-30 ENCOUNTER — Other Ambulatory Visit: Payer: Self-pay

## 2019-09-30 ENCOUNTER — Encounter (HOSPITAL_COMMUNITY): Payer: Self-pay | Admitting: Pulmonary Disease

## 2019-09-30 LAB — CBC
HCT: 34.6 % — ABNORMAL LOW (ref 36.0–46.0)
Hemoglobin: 11.1 g/dL — ABNORMAL LOW (ref 12.0–15.0)
MCH: 30.3 pg (ref 26.0–34.0)
MCHC: 32.1 g/dL (ref 30.0–36.0)
MCV: 94.5 fL (ref 80.0–100.0)
Platelets: 278 10*3/uL (ref 150–400)
RBC: 3.66 MIL/uL — ABNORMAL LOW (ref 3.87–5.11)
RDW: 14.8 % (ref 11.5–15.5)
WBC: 7.8 10*3/uL (ref 4.0–10.5)
nRBC: 0 % (ref 0.0–0.2)

## 2019-09-30 LAB — COMPREHENSIVE METABOLIC PANEL
ALT: 25 U/L (ref 0–44)
AST: 26 U/L (ref 15–41)
Albumin: 2.6 g/dL — ABNORMAL LOW (ref 3.5–5.0)
Alkaline Phosphatase: 57 U/L (ref 38–126)
Anion gap: 12 (ref 5–15)
BUN: 42 mg/dL — ABNORMAL HIGH (ref 8–23)
CO2: 25 mmol/L (ref 22–32)
Calcium: 8.7 mg/dL — ABNORMAL LOW (ref 8.9–10.3)
Chloride: 99 mmol/L (ref 98–111)
Creatinine, Ser: 1.38 mg/dL — ABNORMAL HIGH (ref 0.44–1.00)
GFR calc Af Amer: 42 mL/min — ABNORMAL LOW (ref >60)
GFR calc non Af Amer: 36 mL/min — ABNORMAL LOW (ref >60)
Glucose, Bld: 107 mg/dL — ABNORMAL HIGH (ref 70–99)
Potassium: 3.5 mmol/L (ref 3.5–5.1)
Sodium: 136 mmol/L (ref 135–145)
Total Bilirubin: 0.9 mg/dL (ref 0.3–1.2)
Total Protein: 6.3 g/dL — ABNORMAL LOW (ref 6.5–8.1)

## 2019-09-30 LAB — GLUCOSE, CAPILLARY
Glucose-Capillary: 97 mg/dL (ref 70–99)
Glucose-Capillary: 94 mg/dL (ref 70–99)
Glucose-Capillary: 109 mg/dL — ABNORMAL HIGH (ref 70–99)
Glucose-Capillary: 121 mg/dL — ABNORMAL HIGH (ref 70–99)

## 2019-09-30 MED ORDER — POTASSIUM CHLORIDE CRYS ER 20 MEQ PO TBCR
40.0000 meq | EXTENDED_RELEASE_TABLET | Freq: Once | ORAL | Status: AC
  Administered 2019-09-30: 40 meq via ORAL
  Filled 2019-09-30: qty 2

## 2019-09-30 MED ORDER — QUETIAPINE FUMARATE 25 MG PO TABS
50.0000 mg | ORAL_TABLET | Freq: Two times a day (BID) | ORAL | Status: DC
  Administered 2019-09-30 – 2019-10-03 (×6): 50 mg via ORAL
  Filled 2019-09-30 (×6): qty 2

## 2019-09-30 NOTE — Plan of Care (Signed)

## 2019-09-30 NOTE — Progress Notes (Signed)
PROGRESS NOTE    Christie Harper  [id number] DOB: 1941-05-26 DOA: 09/20/2019 PCP: System, Pcp Not In    Brief Narrative:  78 year old female who presented with dyspnea.  She was admitted to [facility] November 21 in the setting of mild hypoxemia and diarrhea.  Her COVID-19 test came back positive November 22.  Patient developed progressive hypoxic respiratory failure despite 10 days of dexamethasone and 5 days of remdesivir. She was intubated November 30, transferred to [facility] December 2.  Patient received mechanical ventilation per ARDS-net protocol, with good toleration.  Patient was liberated from mechanical ventilation on December 4.   Her hospitalization was been complicated with bacterial pneumonia  coinfection requiring antibiotic therapy, acute focal neurologic deficit with slurred speech and facial droop (negative MRI for acute CVA ) and acute on chronic systolic heart failure.    Assessment & Plan:  Acute hypoxic respiratory failure due to SARS COVID-19 viral pneumonia/secondary bacterial infection Patient was initially intubated.  She was successfully extubated on December 4.  Patient completed treatment with remdesivir and steroids.  She also completed a course of antibacterials.   Patient has been on high doses of oxygen as high as 15 L of high flow nasal cannula.  She has improved in the last 3 days.  She was also given Lasix which seems to have helped.  Held yesterday due to rising BUN and creatinine.  Continue to hold. Continue with incentive spirometry, mobilization and bronchodilators.  PT and OT evaluation.  CIR is recommended.  Acute metabolic encephalopathy According to patient's sister patient is usually quite alert and oriented at baseline.  There is no history of dementia.  MRI brain did show brain atrophy.  She could have mild cognitive impairment which was not evident previously.  Due to acute hospitalization and acute illness that this may  have manifested.   Patient remains distracted and confused at times.  Apparently she was agitated during the night.  We will increase the dose of Seroquel.  No focal deficits noted.   Suspected TIA Apparently there was some concern for slurred speech and facial droop a few days ago.  Patient underwent MRI of the brain which did not show any acute findings.  However it was a motion degraded study.  No focal deficits present.  Continue aspirin and statin.  Dysphagia diet.  Aspiration precautions.  Stable.  Essential hypertension Monitor blood pressures closely.  Chronic systolic CHF ([id number]) Continue beta-blockers and statins.  Lasix have been given depending on volume status.  Looks like her weight has decreased.  Holding Lasix due to increase in BUN and creatinine.  Continue to monitor.   Dyslipidemia Continue with statin therapy.   History of PE/DVT Back on her rivaroxaban.    History of coronary artery disease Stable.  No chest pain.  Continue with home medications.   Severe peripheral vascular disease and carotid artery disease Continue with statin and asa and cilostazol. Follow up as outpatient.   Diabetes mellitus type 2  Due to low normal glucose levels sliding scale coverage was discontinued.  HbA1c 6.1.  CBGs are on the low side but stable.  Encourage oral intake.  CKD stage 3b.  Renal function is stable.  Monitor while she is getting diuresed.  Avoid nephrotoxic agents.  Monitor urine output.    Hypokalemia Potassium is normal.  Normocytic anemia No evidence of overt bleeding. Continue to monitor.  Hemoglobin is stable.   DVT prophylaxis: Rivaroxaban Code Status: Limited Code Family Communication: Sister  being updated on a daily basis Disposition Plan: CIR to be consulted.     Consultants:  Neurology   Procedures:   Transthoracic echocardiogram 09/20/2019 IMPRESSIONS    1. Technically difficult study. Left ventricular ejection fraction, by visual  estimation, is 35 to 40%. The left ventricle has moderately decreased function. There is mildly increased left ventricular hypertrophy. Inferior/septal akinesis  2. Left ventricular diastolic parameters are consistent with Grade I diastolic dysfunction (impaired relaxation).  3. Global right ventricle has normal systolic function. The right ventricular size is normal.  4. Left atrial size was normal.  5. Right atrial size was not well visualized.  6. The mitral valve is normal in structure. Trace mitral valve regurgitation.  7. The tricuspid valve is not well visualized. Tricuspid valve regurgitation is trivial.  8. The aortic valve was not well visualized. Aortic valve regurgitation is mild. No evidence of aortic valve stenosis.  9. The pulmonic valve was not well visualized. Pulmonic valve regurgitation is not visualized.   Antimicrobials:  Anti-infectives (From admission, onward)   Start     Dose/Rate Route Frequency Ordered Stop   09/21/19 1100  ceFEPIme (MAXIPIME) 2 g in sodium chloride 0.9 % 100 mL IVPB     2 g 200 mL/hr over 30 Minutes Intravenous Every 12 hours 09/21/19 1022 09/26/19 0118        Subjective: Patient somnolent this morning.  Easily arousable.  In no distress.  Apparently agitated during the course of the night.  Objective: Vitals:   09/30/19 0743 09/30/19 0744 09/30/19 0800 09/30/19 1136  BP:   124/63 116/62  Pulse:   89   Resp:   17   Temp: 97.7 F (36.5 C) 97.7 F (36.5 C) 97.7 F (36.5 C) 98.1 F (36.7 C)  TempSrc: Oral Oral Oral Oral  SpO2:   97%   Weight:      Height:        Intake/Output Summary (Last 24 hours) at 09/30/2019 1426 Last data filed at 09/30/2019 1318 Gross per 24 hour  Intake 180 ml  Output --  Net 180 ml   Filed Weights   09/27/19 0447 09/28/19 0419 09/30/19 0438  Weight: 57.5 kg 70.8 kg 56.8 kg    Examination:   General appearance: Somnolent today.  Easily arousable. Resp: Normal effort at rest.  Coarse breath  sounds.  Crackles at the bases.  No wheezing or rhonchi. Cardio: S1-S2 is normal regular.  No S3-S4.  No rubs murmurs or bruit GI: Abdomen is soft.  Nontender nondistended.  Bowel sounds are present normal.  No masses organomegaly Extremities: No edema.  Neurologic: No obvious focal deficits.    Data Reviewed: I have personally reviewed following labs and imaging studies  CBC: Recent Labs  Lab 09/24/19 0400 09/25/19 0130 09/28/19 0015 09/29/19 0411 09/30/19 0619  WBC 12.7* 7.7 9.0 7.7 7.8  NEUTROABS 11.5* 6.3  --   --   --   HGB 13.3 11.9* 10.9* 10.6* 11.1*  HCT 41.6 37.2 34.1* 33.3* 34.6*  MCV 94.1 93.9 93.2 94.9 94.5  PLT 263 232 218 227 0000000   Basic Metabolic Panel: Recent Labs  Lab 09/24/19 0400 09/25/19 0130 09/27/19 0210 09/28/19 0015 09/29/19 0411 09/30/19 0619  NA 145 140 136 138 138 136  K 3.6 3.7 3.4* 3.9 3.2* 3.5  CL 96* 95* 96* 97* 98 99  CO2 34* 33* 25 27 23 25   GLUCOSE 136* 109* 105* 103* 105* 107*  BUN 67* 64* 39* 38* 41* 42*  CREATININE 1.31* 1.19* 1.11* 1.21* 1.38* 1.38*  CALCIUM 9.8 9.4 9.2 9.5 8.6* 8.7*  MG 2.2  --   --  1.9  --   --   PHOS 3.8  --   --   --   --   --    GFR: Estimated Creatinine Clearance: 29.2 mL/min (A) (by C-G formula based on SCr of 1.38 mg/dL (H)). Liver Function Tests: Recent Labs  Lab 09/24/19 0400 09/25/19 0130 09/28/19 0015 09/29/19 0411 09/30/19 0619  AST 30 24 30 27 26   ALT 25 22 28 26 25   ALKPHOS 57 54 57 55 57  BILITOT 1.1 0.8 0.8 0.8 0.9  PROT 6.8 6.2* 6.3* 5.9* 6.3*  ALBUMIN 2.6* 2.2* 2.3* 2.3* 2.6*   CBG: Recent Labs  Lab 09/29/19 1130 09/29/19 1641 09/29/19 2101 09/30/19 0739 09/30/19 1135  GLUCAP 93 102* 98 97 94     Radiology Studies: DG CHEST PORT 1 VIEW  Result Date: 09/28/2019 CLINICAL DATA:  CHF. EXAM: PORTABLE CHEST 1 VIEW COMPARISON:  09/23/2019.  09/20/2019. FINDINGS: Right PICC line noted with tip over superior vena cava. Stable cardiomegaly. No pulmonary venous congestion.  Diffuse bilateral pulmonary infiltrates. Slight worsening from prior exam. No pleural effusion or pneumothorax. Prior left mastectomy. Surgical clips over upper abdomen. Degenerative changes thoracic spine. IMPRESSION: 1.  Right PICC line noted with tip over superior vena cava. 2. Diffuse bilateral pulmonary interstitial infiltrates. Slight worsening from prior exam. Electronically Signed   By: Marcello Moores  Register   On: 09/28/2019 08:15       Scheduled Meds: . aspirin EC  81 mg Oral Daily  . Chlorhexidine Gluconate Cloth  6 each Topical Daily  . cilostazol  50 mg Oral BID  . DULoxetine  60 mg Oral BID  . feeding supplement (ENSURE ENLIVE)  237 mL Oral TID BM  . ferrous sulfate  325 mg Oral BID  . mouth rinse  15 mL Mouth Rinse BID  . metoprolol tartrate  25 mg Oral BID  . multivitamin with minerals  1 tablet Oral Daily  . QUEtiapine  25 mg Oral BID  . rivaroxaban  10 mg Oral Daily  . senna-docusate  3 tablet Oral BID  . simvastatin  20 mg Oral QHS  . vitamin C  500 mg Per Tube Daily  . zinc sulfate  220 mg Per Tube Daily   Continuous Infusions:    LOS: 10 days     Bonnielee Haff, MD Pager: [email or web address]

## 2019-09-30 NOTE — Progress Notes (Signed)
Patient able to speak to her sister nancy over speakerphone. Izora Gala updated on patient's plan of care. All questions and concerns addressed.

## 2019-10-01 LAB — CBC
HCT: 34.2 % — ABNORMAL LOW (ref 36.0–46.0)
Hemoglobin: 10.7 g/dL — ABNORMAL LOW (ref 12.0–15.0)
MCH: 30.2 pg (ref 26.0–34.0)
MCHC: 31.3 g/dL (ref 30.0–36.0)
MCV: 96.6 fL (ref 80.0–100.0)
Platelets: 256 10*3/uL (ref 150–400)
RBC: 3.54 MIL/uL — ABNORMAL LOW (ref 3.87–5.11)
RDW: 14.9 % (ref 11.5–15.5)
WBC: 7.2 10*3/uL (ref 4.0–10.5)
nRBC: 0 % (ref 0.0–0.2)

## 2019-10-01 LAB — COMPREHENSIVE METABOLIC PANEL
ALT: 25 U/L (ref 0–44)
AST: 26 U/L (ref 15–41)
Albumin: 2.6 g/dL — ABNORMAL LOW (ref 3.5–5.0)
Alkaline Phosphatase: 56 U/L (ref 38–126)
Anion gap: 11 (ref 5–15)
BUN: 36 mg/dL — ABNORMAL HIGH (ref 8–23)
CO2: 24 mmol/L (ref 22–32)
Calcium: 8.7 mg/dL — ABNORMAL LOW (ref 8.9–10.3)
Chloride: 102 mmol/L (ref 98–111)
Creatinine, Ser: 1.26 mg/dL — ABNORMAL HIGH (ref 0.44–1.00)
GFR calc Af Amer: 47 mL/min — ABNORMAL LOW (ref >60)
GFR calc non Af Amer: 40 mL/min — ABNORMAL LOW (ref >60)
Glucose, Bld: 80 mg/dL (ref 70–99)
Potassium: 4.5 mmol/L (ref 3.5–5.1)
Sodium: 137 mmol/L (ref 135–145)
Total Bilirubin: 0.7 mg/dL (ref 0.3–1.2)
Total Protein: 6.2 g/dL — ABNORMAL LOW (ref 6.5–8.1)

## 2019-10-01 LAB — GLUCOSE, CAPILLARY
Glucose-Capillary: 86 mg/dL (ref 70–99)
Glucose-Capillary: 68 mg/dL — ABNORMAL LOW (ref 70–99)
Glucose-Capillary: 96 mg/dL (ref 70–99)
Glucose-Capillary: 106 mg/dL — ABNORMAL HIGH (ref 70–99)

## 2019-10-01 MED ORDER — ACETAMINOPHEN 325 MG PO TABS
650.0000 mg | ORAL_TABLET | Freq: Four times a day (QID) | ORAL | Status: DC | PRN
  Administered 2019-10-01: 650 mg via ORAL
  Filled 2019-10-01: qty 2

## 2019-10-01 MED ORDER — ALPRAZOLAM 0.25 MG PO TABS
0.2500 mg | ORAL_TABLET | Freq: Once | ORAL | Status: AC
  Administered 2019-10-01: 0.25 mg via ORAL
  Filled 2019-10-01: qty 1

## 2019-10-01 NOTE — Progress Notes (Signed)
Patient resting comfortably in pain, with no apparent agitation. Safety sitter at bedside. VS WNL. POC is waiting on COVID results to determine placement to CIR.

## 2019-10-01 NOTE — Plan of Care (Signed)
  Problem: Education: Goal: Knowledge of General Education information will improve Description: Including pain rating scale, medication(s)/side effects and non-pharmacologic comfort measures 10/01/2019 0948 by Gunnar Bulla, RN Outcome: Progressing 10/01/2019 0804 by Gunnar Bulla, RN Outcome: Progressing   Problem: Health Behavior/Discharge Planning: Goal: Ability to manage health-related needs will improve 10/01/2019 0948 by Gunnar Bulla, RN Outcome: Progressing 10/01/2019 0804 by Gunnar Bulla, RN Outcome: Progressing   Problem: Clinical Measurements: Goal: Ability to maintain clinical measurements within normal limits will improve 10/01/2019 0948 by Gunnar Bulla, RN Outcome: Progressing 10/01/2019 0804 by Gunnar Bulla, RN Outcome: Progressing Goal: Will remain free from infection 10/01/2019 0948 by Gunnar Bulla, RN Outcome: Progressing 10/01/2019 0804 by Gunnar Bulla, RN Outcome: Progressing Goal: Diagnostic test results will improve 10/01/2019 0948 by Gunnar Bulla, RN Outcome: Progressing 10/01/2019 0804 by Gunnar Bulla, RN Outcome: Progressing Goal: Respiratory complications will improve 10/01/2019 0948 by Gunnar Bulla, RN Outcome: Progressing 10/01/2019 0804 by Gunnar Bulla, RN Outcome: Progressing Goal: Cardiovascular complication will be avoided 10/01/2019 0948 by Gunnar Bulla, RN Outcome: Progressing 10/01/2019 0804 by Gunnar Bulla, RN Outcome: Progressing   Problem: Activity: Goal: Risk for activity intolerance will decrease 10/01/2019 0948 by Gunnar Bulla, RN Outcome: Progressing 10/01/2019 0804 by Gunnar Bulla, RN Outcome: Progressing   Problem: Nutrition: Goal: Adequate nutrition will be maintained 10/01/2019 0948 by Gunnar Bulla, RN Outcome: Progressing 10/01/2019 0804 by Gunnar Bulla, RN Outcome: Progressing   Problem: Coping: Goal: Level  of anxiety will decrease 10/01/2019 0948 by Gunnar Bulla, RN Outcome: Progressing 10/01/2019 0804 by Gunnar Bulla, RN Outcome: Progressing   Problem: Elimination: Goal: Will not experience complications related to bowel motility 10/01/2019 0948 by Gunnar Bulla, RN Outcome: Progressing 10/01/2019 0804 by Gunnar Bulla, RN Outcome: Progressing Goal: Will not experience complications related to urinary retention 10/01/2019 0948 by Gunnar Bulla, RN Outcome: Progressing 10/01/2019 0804 by Gunnar Bulla, RN Outcome: Progressing   Problem: Pain Managment: Goal: General experience of comfort will improve 10/01/2019 0948 by Gunnar Bulla, RN Outcome: Progressing 10/01/2019 0804 by Gunnar Bulla, RN Outcome: Progressing   Problem: Safety: Goal: Ability to remain free from injury will improve 10/01/2019 0948 by Gunnar Bulla, RN Outcome: Progressing 10/01/2019 0804 by Gunnar Bulla, RN Outcome: Progressing   Problem: Skin Integrity: Goal: Risk for impaired skin integrity will decrease 10/01/2019 0948 by Gunnar Bulla, RN Outcome: Progressing 10/01/2019 0804 by Gunnar Bulla, RN Outcome: Progressing   Problem: Education: Goal: Knowledge of risk factors and measures for prevention of condition will improve 10/01/2019 0948 by Gunnar Bulla, RN Outcome: Progressing 10/01/2019 0804 by Gunnar Bulla, RN Outcome: Progressing   Problem: Coping: Goal: Psychosocial and spiritual needs will be supported 10/01/2019 0948 by Gunnar Bulla, RN Outcome: Progressing 10/01/2019 0804 by Gunnar Bulla, RN Outcome: Progressing   Problem: Respiratory: Goal: Will maintain a patent airway 10/01/2019 0948 by Gunnar Bulla, RN Outcome: Progressing 10/01/2019 0804 by Gunnar Bulla, RN Outcome: Progressing Goal: Complications related to the disease process, condition or treatment will be avoided or  minimized 10/01/2019 0948 by Gunnar Bulla, RN Outcome: Progressing 10/01/2019 0804 by Gunnar Bulla, RN Outcome: Progressing

## 2019-10-01 NOTE — Progress Notes (Signed)
PROGRESS NOTE    Christie Harper  [id number] DOB: [birth year] DOA: 09/20/2019 PCP: System, Pcp Not In    Brief Narrative:  78 year old female who presented with dyspnea.  She was admitted to [facility] November 21 in the setting of mild hypoxemia and diarrhea.  Her COVID-19 test came back positive November 22.  Patient developed progressive hypoxic respiratory failure despite 10 days of dexamethasone and 5 days of remdesivir. She was intubated November 30, transferred to [facility] December 2.  Patient received mechanical ventilation per ARDS-net protocol, with good toleration.  Patient was liberated from mechanical ventilation on December 4.   Her hospitalization was been complicated with bacterial pneumonia  coinfection requiring antibiotic therapy, acute focal neurologic deficit with slurred speech and facial droop (negative MRI for acute CVA ) and acute on chronic systolic heart failure.    Assessment & Plan:  Acute hypoxic respiratory failure due to SARS COVID-19 viral pneumonia/secondary bacterial infection Patient was initially intubated.  She was successfully extubated on December 4.  Patient completed treatment with remdesivir and steroids.  She also completed a course of antibacterials.   Patient patient was requiring high flow nasal cannula at 15 L/min.  She has improved in the last several days.  She is now down to 2 L of oxygen by nasal cannula.  Saturating in the early 90s.  She feels better.   Patient was also given diuretics which seems to have helped.  Held due to rising BUN and creatinine.  Volume status seems to be stable.  Continue to hold.   Continue with incentive spirometry, mobilization, bronchodilators.  PT and OT evaluation.  CIR is recommended.    Acute metabolic encephalopathy According to patient's sister patient is usually quite alert and oriented at baseline.  There is no history of dementia.  MRI brain did show brain atrophy.  She could  have mild cognitive impairment which was not evident previously.  Due to acute hospitalization and acute illness that this may have manifested.   Patient has episodes of agitation late at night and early in the morning.  She has been placed on Seroquel.  Dose was increased yesterday.  She does not have any focal neurological deficits.  At other times she is able to answer questions appropriately.  Noted to have a sitter this morning.     Suspected TIA Apparently there was some concern for slurred speech and facial droop a few days ago.  Patient underwent MRI of the brain which did not show any acute findings.  However it was a motion degraded study.  No focal deficits present.  Continue aspirin and statin.  Dysphagia diet.  Aspiration precautions.  She remains stable.  Essential hypertension Blood pressure is reasonably well controlled.  Chronic systolic CHF ([id number]) Continue beta-blockers and statins.  Seems to be stable at this time.  Euvolemic.  She has received Lasix here in the hospital to maintain her negative fluid balance.  Continue to hold due to increase in creatinine.  Labs are pending from this morning.  Dyslipidemia Continue with statin therapy.   History of PE/DVT Back on her rivaroxaban.    History of coronary artery disease Stable.  No chest pain.  Continue with home medications.   Severe peripheral vascular disease and carotid artery disease Continue with statin and asa and cilostazol. Follow up as outpatient.   Diabetes mellitus type 2  Due to low normal glucose levels sliding scale coverage was discontinued.  HbA1c 6.1.  CBGs are on  the low side but stable.  Encourage oral intake.  CKD stage 3b.  Renal function is stable.  Monitor while she is getting diuresed.  Avoid nephrotoxic agents.  Monitor urine output.    Hypokalemia Potassium is normal.  Normocytic anemia No evidence of overt bleeding. Continue to monitor.  Hemoglobin is stable.   DVT prophylaxis:  Rivaroxaban Code Status: Limited Code Family Communication: Sister being updated on a daily basis Disposition Plan: Waiting for input from inpatient rehabilitation.  She has been retested for COVID-19.  Waiting on those results as well.   Consultants:  Neurology   Procedures:   Transthoracic echocardiogram 09/20/2019 IMPRESSIONS    1. Technically difficult study. Left ventricular ejection fraction, by visual estimation, is 35 to 40%. The left ventricle has moderately decreased function. There is mildly increased left ventricular hypertrophy. Inferior/septal akinesis  2. Left ventricular diastolic parameters are consistent with Grade I diastolic dysfunction (impaired relaxation).  3. Global right ventricle has normal systolic function. The right ventricular size is normal.  4. Left atrial size was normal.  5. Right atrial size was not well visualized.  6. The mitral valve is normal in structure. Trace mitral valve regurgitation.  7. The tricuspid valve is not well visualized. Tricuspid valve regurgitation is trivial.  8. The aortic valve was not well visualized. Aortic valve regurgitation is mild. No evidence of aortic valve stenosis.  9. The pulmonic valve was not well visualized. Pulmonic valve regurgitation is not visualized.   Antimicrobials:  Anti-infectives (From admission, onward)   Start     Dose/Rate Route Frequency Ordered Stop   09/21/19 1100  ceFEPIme (MAXIPIME) 2 g in sodium chloride 0.9 % 100 mL IVPB     2 g 200 mL/hr over 30 Minutes Intravenous Every 12 hours 09/21/19 1022 09/26/19 0118        Subjective: Patient awake alert.  Answers questions appropriately.  Slightly distracted at times.  Denies any shortness of breath or pain.  Objective: Vitals:   09/30/19 2300 10/01/19 0010 10/01/19 0400 10/01/19 0735  BP:  (!) 113/57 (!) 120/56 135/68  Pulse: 99 83 85 98  Resp: 14 18 14    Temp:    98.3 F (36.8 C)  TempSrc:    Oral  SpO2: 97% 98% 98% 96%  Weight:       Height:        Intake/Output Summary (Last 24 hours) at 10/01/2019 1012 Last data filed at 09/30/2019 1710 Gross per 24 hour  Intake 300 ml  Output --  Net 300 ml   Filed Weights   09/27/19 0447 09/28/19 0419 09/30/19 0438  Weight: 57.5 kg 70.8 kg 56.8 kg    Examination:   General appearance: Awake alert.  Mildly distracted.  In no distress. Resp: Coarse breath sounds bilaterally.  Coarse at the bases.  No wheezing rales or rhonchi. Cardio: S1-S2 is normal regular.  No S3-S4.  No rubs murmurs or bruit GI: Abdomen is soft.  Nontender nondistended.  Bowel sounds are present normal.  No masses organomegaly Extremities: No edema.  Neurologic:   No focal neurological deficits.     Data Reviewed: I have personally reviewed following labs and imaging studies  CBC: Recent Labs  Lab 09/25/19 0130 09/28/19 0015 09/29/19 0411 09/30/19 0619 10/01/19 0415  WBC 7.7 9.0 7.7 7.8 7.2  NEUTROABS 6.3  --   --   --   --   HGB 11.9* 10.9* 10.6* 11.1* 10.7*  HCT 37.2 34.1* 33.3* 34.6* 34.2*  MCV 93.9  93.2 94.9 94.5 96.6  PLT 232 218 227 278 [id number]   Basic Metabolic Panel: Recent Labs  Lab 09/25/19 0130 09/27/19 0210 09/28/19 0015 09/29/19 0411 09/30/19 0619  NA 140 136 138 138 136  K 3.7 3.4* 3.9 3.2* 3.5  CL 95* 96* 97* 98 99  CO2 33* 25 27 23 25   GLUCOSE 109* 105* 103* 105* 107*  BUN 64* 39* 38* 41* 42*  CREATININE 1.19* 1.11* 1.21* 1.38* 1.38*  CALCIUM 9.4 9.2 9.5 8.6* 8.7*  MG  --   --  1.9  --   --    GFR: Estimated Creatinine Clearance: 29.2 mL/min (A) (by C-G formula based on SCr of 1.38 mg/dL (H)). Liver Function Tests: Recent Labs  Lab 09/25/19 0130 09/28/19 0015 09/29/19 0411 09/30/19 0619  AST 24 30 27 26   ALT 22 28 26 25   ALKPHOS 54 57 55 57  BILITOT 0.8 0.8 0.8 0.9  PROT 6.2* 6.3* 5.9* 6.3*  ALBUMIN 2.2* 2.3* 2.3* 2.6*   CBG: Recent Labs  Lab 09/30/19 0739 09/30/19 1135 09/30/19 1622 09/30/19 2232 10/01/19 0738  GLUCAP 97 94 109* 121* 86      Radiology Studies: DG CHEST PORT 1 VIEW  Result Date: 09/28/2019 CLINICAL DATA:  CHF. EXAM: PORTABLE CHEST 1 VIEW COMPARISON:  09/23/2019.  09/20/2019. FINDINGS: Right PICC line noted with tip over superior vena cava. Stable cardiomegaly. No pulmonary venous congestion. Diffuse bilateral pulmonary infiltrates. Slight worsening from prior exam. No pleural effusion or pneumothorax. Prior left mastectomy. Surgical clips over upper abdomen. Degenerative changes thoracic spine. IMPRESSION: 1.  Right PICC line noted with tip over superior vena cava. 2. Diffuse bilateral pulmonary interstitial infiltrates. Slight worsening from prior exam. Electronically Signed   By: Marcello Moores  Register   On: 09/28/2019 08:15       Scheduled Meds: . aspirin EC  81 mg Oral Daily  . Chlorhexidine Gluconate Cloth  6 each Topical Daily  . cilostazol  50 mg Oral BID  . DULoxetine  60 mg Oral BID  . feeding supplement (ENSURE ENLIVE)  237 mL Oral TID BM  . ferrous sulfate  325 mg Oral BID  . mouth rinse  15 mL Mouth Rinse BID  . metoprolol tartrate  25 mg Oral BID  . multivitamin with minerals  1 tablet Oral Daily  . QUEtiapine  50 mg Oral BID  . rivaroxaban  10 mg Oral Daily  . senna-docusate  3 tablet Oral BID  . simvastatin  20 mg Oral QHS  . vitamin C  500 mg Per Tube Daily  . zinc sulfate  220 mg Per Tube Daily   Continuous Infusions:    LOS: 11 days     Bonnielee Haff, MD Pager: On [email or web address]

## 2019-10-01 NOTE — Progress Notes (Signed)
Patient's sister Izora Gala able to speak to patient on phone and updated on plan of care.

## 2019-10-01 NOTE — Plan of Care (Signed)

## 2019-10-01 NOTE — Progress Notes (Signed)
Placed call to [place], patient's sister. Did not answer and voicemail is not accepting messages. Will try calling her later to give her update on patient.

## 2019-10-02 DIAGNOSIS — I10 Essential (primary) hypertension: Secondary | ICD-10-CM

## 2019-10-02 LAB — CBC
HCT: 38.3 % (ref 36.0–46.0)
Hemoglobin: 11.8 g/dL — ABNORMAL LOW (ref 12.0–15.0)
MCH: 29.9 pg (ref 26.0–34.0)
MCHC: 30.8 g/dL (ref 30.0–36.0)
MCV: 97 fL (ref 80.0–100.0)
Platelets: 291 10*3/uL (ref 150–400)
RBC: 3.95 MIL/uL (ref 3.87–5.11)
RDW: 15 % (ref 11.5–15.5)
WBC: 9.9 10*3/uL (ref 4.0–10.5)
nRBC: 0 % (ref 0.0–0.2)

## 2019-10-02 LAB — COMPREHENSIVE METABOLIC PANEL
ALT: 20 U/L (ref 0–44)
AST: 24 U/L (ref 15–41)
Albumin: 2.3 g/dL — ABNORMAL LOW (ref 3.5–5.0)
Alkaline Phosphatase: 52 U/L (ref 38–126)
Anion gap: 10 (ref 5–15)
BUN: 33 mg/dL — ABNORMAL HIGH (ref 8–23)
CO2: 20 mmol/L — ABNORMAL LOW (ref 22–32)
Calcium: 8.4 mg/dL — ABNORMAL LOW (ref 8.9–10.3)
Chloride: 106 mmol/L (ref 98–111)
Creatinine, Ser: 1.14 mg/dL — ABNORMAL HIGH (ref 0.44–1.00)
GFR calc Af Amer: 53 mL/min — ABNORMAL LOW (ref >60)
GFR calc non Af Amer: 45 mL/min — ABNORMAL LOW (ref >60)
Glucose, Bld: 87 mg/dL (ref 70–99)
Potassium: 4.1 mmol/L (ref 3.5–5.1)
Sodium: 136 mmol/L (ref 135–145)
Total Bilirubin: 0.7 mg/dL (ref 0.3–1.2)
Total Protein: 5.5 g/dL — ABNORMAL LOW (ref 6.5–8.1)

## 2019-10-02 LAB — NOVEL CORONAVIRUS, NAA (HOSP ORDER, SEND-OUT TO REF LAB; TAT 18-24 HRS): SARS-CoV-2, NAA: DETECTED — AB

## 2019-10-02 LAB — GLUCOSE, CAPILLARY
Glucose-Capillary: 95 mg/dL (ref 70–99)
Glucose-Capillary: 76 mg/dL (ref 70–99)
Glucose-Capillary: 125 mg/dL — ABNORMAL HIGH (ref 70–99)
Glucose-Capillary: 93 mg/dL (ref 70–99)
Glucose-Capillary: 99 mg/dL (ref 70–99)

## 2019-10-02 MED ORDER — ALPRAZOLAM 0.25 MG PO TABS
0.2500 mg | ORAL_TABLET | Freq: Once | ORAL | Status: AC
  Administered 2019-10-02: 0.25 mg via ORAL
  Filled 2019-10-02: qty 1

## 2019-10-02 NOTE — Progress Notes (Signed)
Dr. [place] Pink notified that I had to increase oxygen to HFNC 7L/min. O2 sats at 92%.

## 2019-10-02 NOTE — Progress Notes (Signed)
Physical Therapy Treatment Patient Details Name: Christie Harper MRN: [id number] DOB: [birth year] Today's Date: 10/02/2019    History of Present Illness Pt is an 78 y.o. female initially admitted to [facility] on 09/09/19 with mild hypoxemia and diarrhea, (+) COVID-19 test 11/22, treated with Decadron for 10 days, remdesivir 5 days; hypoxemia progressed requiring ETT 11/30-12/4. Head CT negative for acute abnormality; MRI 12/7 with no acute ischemia and severe chronic small vessel disease; pt with continued AMS. 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   PT Comments    Pt demonstrates improved participation and cognition this session. Continues to yell out, "Lord, help me/us" but demonstrates improved reception of conversation and information. Pt requires frequent redirection to task, but when focused on task or conversation, does not yell out until lull in whatever focus was on. Able to progress transfer and gait training; pt requires modA to prevent LOB without UE support, demonstrates poor balance strategies/postural reactions. 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 Agreeable to rehab in order to return home. Continue to recommend intensive CIR-level therapies to maximize functional mobility, cognitive status and independence prior to return home.    Follow Up Recommendations  CIR;Supervision/Assistance - 24 hour     Equipment Recommendations  (TBD)    Recommendations for Other Services       Precautions / Restrictions Precautions Precautions: Fall Precaution Comments: states "Lord help [place]" over and over Restrictions Weight Bearing Restrictions: No    Mobility  Bed Mobility               General  bed mobility comments: Received sitting in recliner  Transfers Overall transfer level: Needs assistance Equipment used: Rolling walker (2 wheeled);1 person hand held assist Transfers: Sit to/from Stand Sit to Stand: Mod assist;Min assist         General transfer comment: Initial trial minA, subsequent standing trials requiring modA to elevate trunk, increased time to achieve fully upright posture; reliant on assist to maintain balance  Ambulation/Gait Ambulation/Gait assistance: Min assist;Mod assist Gait Distance (Feet): 20 Feet(+20) Assistive device: Rolling walker (2 wheeled);2 person hand held assist Gait Pattern/deviations: Step-through pattern;Decreased stride length;Trunk flexed;Staggering right Gait velocity: Decreased   General Gait Details: Intial amb with RW and minA for balance; further trials without DME, pt reliant on HHA and modA to maintain balance, reaching to furniture for added stability; c/o SOB, SpO2 stable on 3L O2 [place]   Stairs             Wheelchair Mobility    Modified Rankin (Stroke Patients Only)       Balance Overall balance assessment: Needs assistance Sitting-balance support: Feet supported;No upper extremity supported Sitting balance-Leahy Scale: Fair     Standing balance support: During functional activity;Bilateral upper extremity supported;Single extremity supported Standing balance-Leahy Scale: Poor Standing balance comment: Reliant on UE support and external assist                            Cognition Arousal/Alertness: Awake/alert Behavior During Therapy: Flat affect;Restless Overall Cognitive Status: Impaired/Different from baseline Area of Impairment: Orientation;Memory;Attention;Following commands;Safety/judgement;Awareness;Problem solving                 Orientation Level: Disoriented to;Time;Situation Current Attention Level: Sustained  Memory: Decreased short-term memory Following Commands: Follows one  step commands consistently;Follows one step commands with increased time Safety/Judgement: Decreased awareness of safety;Decreased awareness of deficits Awareness: Intellectual;Emergent Problem Solving: Difficulty sequencing;Requires verbal cues;Requires tactile cues;Slow processing General Comments: pt continues to state "Lord, help [place]... Lord help me" often, but when focused on task or conversation noted that pt stops yelling this. After moving with therapist, pt now saying "Lord than you for 'helpening' me." Pt carrying appropriate conversation regarding PLOF and being retired Careers information officer, asking when she is going home, reports wanting rehab so she can go home      Exercises Other Exercises Other Exercises: Discussed seated LE therex (hip flex/marching, SLR, LAQ), pt declining practice    General Comments General comments (skin integrity, edema, etc.): Discussed how pt retired Careers information officer, enjoys basketball and tennis, responded well to PT reading worship song lyrics to her (pt declining listening to music). Slight skin breakdown noted on upper lip (likely from nasal canula), RN notified      Pertinent Vitals/Pain Pain Assessment: Faces Faces Pain Scale: Hurts a little bit Pain Location: Generalized Pain Descriptors / Indicators: Discomfort Pain Intervention(s): Monitored during session;Repositioned    Home Living                      Prior Function            PT Goals (current goals can now be found in the care plan section) Acute Rehab PT Goals Patient Stated Goal: Agreeable to rehab so she can go home PT Goal Formulation: With patient Time For Goal Achievement: 10/09/19 Potential to Achieve Goals: Good Progress towards PT goals: Progressing toward goals    Frequency    Min 3X/week      PT Plan Current plan remains appropriate;Frequency needs to be updated    Co-evaluation              AM-PAC PT "6 Clicks" Mobility   Outcome Measure  Help needed  turning from your back to your side while in a flat bed without using bedrails?: None Help needed moving from lying on your back to sitting on the side of a flat bed without using bedrails?: None Help needed moving to and from a bed to a chair (including a wheelchair)?: A Lot Help needed standing up from a chair using your arms (e.g., wheelchair or bedside chair)?: A Lot Help needed to walk in hospital room?: A Little Help needed climbing 3-5 steps with a railing? : A Lot 6 Click Score: 17    End of Session Equipment Utilized During Treatment: Oxygen Activity Tolerance: Patient tolerated treatment well Patient left: in chair;with call bell/phone within reach;with chair alarm set Nurse Communication: Mobility status PT Visit Diagnosis: Unsteadiness on feet (R26.81);Muscle weakness (generalized) (M62.81);Difficulty in walking, not elsewhere classified (R26.2);Other symptoms and signs involving the nervous system (R29.898)     Time: 3976-7341 PT Time Calculation (min) (ACUTE ONLY): 22 min  Charges:  $Therapeutic Activity: 8-22 mins                    Mabeline Caras, PT, DPT Acute Rehabilitation Services  Pager [phone] Office [place] 10/02/2019, 12:39 PM

## 2019-10-02 NOTE — Progress Notes (Signed)
Inpatient Rehab Admissions:  Inpatient Rehab Consult received.  I attempted to reach pt and her sister for rehabilitation assessment and to discuss goals and expectations of an inpatient rehab admission.  I was unable to reach pt on room phone, and left a voicemail for sister.  Will continue to attempt to contact.  Signed: Shann Medal, PT, DPT Admissions Coordinator [phone] 10/02/19  1:20 PM

## 2019-10-02 NOTE — Progress Notes (Signed)
PROGRESS NOTE    Christie Harper  [id number] DOB: 30-Apr-1941 DOA: 09/20/2019 PCP: System, Pcp Not In    Brief Narrative:  78 year old female who presented with dyspnea.  She was admitted to [facility] November 21 in the setting of mild hypoxemia and diarrhea.  Her COVID-19 test came back positive November 22.  Patient developed progressive hypoxic respiratory failure despite 10 days of dexamethasone and 5 days of remdesivir. She was intubated November 30, transferred to [facility] December 2.  Patient received mechanical ventilation per ARDS-net protocol, with good toleration.  Patient was liberated from mechanical ventilation on December 4.   Her hospitalization was been complicated with bacterial pneumonia  coinfection requiring antibiotic therapy, acute focal neurologic deficit with slurred speech and facial droop (negative MRI for acute CVA ) and acute on chronic systolic heart failure.    Assessment & Plan:  Acute hypoxic respiratory failure due to SARS COVID-19 viral pneumonia/secondary bacterial infection Patient was initially intubated.  She was successfully extubated on December 4.  Patient completed treatment with remdesivir and steroids.  She also completed a course of antibacterials.   Patient was initially requiring high flow nasal cannula at 15 L/min.  She has improved in the last several days.  She is now down to 2 L of oxygen by nasal cannula.  Saturating in the early 90s.    Patient was also given diuretics which seems to have helped.  Held due to rising BUN and creatinine.  Volume status seems to be stable.  Continue to hold.   Continue with incentive spirometry, mobilization, bronchodilators.  PT and OT evaluation.  CIR is recommended.   Patient remains stable from a respiratory standpoint.  Acute metabolic encephalopathy According to patient's sister patient is usually quite alert and oriented at baseline.  There is no history of dementia.  MRI  brain did show brain atrophy.  She could have mild cognitive impairment which was not evident previously.  Due to acute hospitalization and acute illness this may have manifested.   Patient was experiencing episodes of agitation late at night and early in the morning.  Patient was started on Seroquel.  Dose was adjusted.  No focal neurological deficits were noted.  Behaviors appears to have improved.  Continue to monitor.      Suspected TIA Apparently there was some concern for slurred speech and facial droop a few days ago.  Patient underwent MRI of the brain which did not show any acute findings.  However it was a motion degraded study.  No focal deficits present.  Continue aspirin and statin.  Dysphagia diet.  Aspiration precautions.  She remains stable.  Essential hypertension Blood pressure is reasonably well controlled.  Chronic systolic CHF (EF [id number]) Seems to be euvolemic.  Continue beta-blockers and statins.  She was given Lasix intermittently.  Currently on hold.  Continue to monitor.  Dyslipidemia Continue with statin therapy.   History of PE/DVT Back on her rivaroxaban.    History of coronary artery disease Stable.  No chest pain.  Continue with home medications.   Severe peripheral vascular disease and carotid artery disease Continue with statin and asa and cilostazol. Follow up as outpatient.   Diabetes mellitus type 2 patient has a hypoglycemia Due to low normal glucose levels sliding scale coverage was discontinued.  HbA1c 6.1.  CBGs are on the low side but stable.  Continue to encourage oral intake.  CKD stage 3b.  Renal function is stable.  Creatinine is 1.14 today.  Monitor  urine output.    Hypokalemia Repleted  Normocytic anemia No evidence for overt bleeding.  Hemoglobin has been stable.   DVT prophylaxis: Rivaroxaban Code Status: Limited Code Family Communication: Sister being updated on a daily basis Disposition Plan: Waiting for input from inpatient  rehabilitation.  She has been retested for COVID-19.  Waiting on those results as well.   Consultants:  Neurology   Procedures:   Transthoracic echocardiogram 09/20/2019 IMPRESSIONS    1. Technically difficult study. Left ventricular ejection fraction, by visual estimation, is 35 to 40%. The left ventricle has moderately decreased function. There is mildly increased left ventricular hypertrophy. Inferior/septal akinesis  2. Left ventricular diastolic parameters are consistent with Grade I diastolic dysfunction (impaired relaxation).  3. Global right ventricle has normal systolic function. The right ventricular size is normal.  4. Left atrial size was normal.  5. Right atrial size was not well visualized.  6. The mitral valve is normal in structure. Trace mitral valve regurgitation.  7. The tricuspid valve is not well visualized. Tricuspid valve regurgitation is trivial.  8. The aortic valve was not well visualized. Aortic valve regurgitation is mild. No evidence of aortic valve stenosis.  9. The pulmonic valve was not well visualized. Pulmonic valve regurgitation is not visualized.   Antimicrobials:  Anti-infectives (From admission, onward)   Start     Dose/Rate Route Frequency Ordered Stop   09/21/19 1100  ceFEPIme (MAXIPIME) 2 g in sodium chloride 0.9 % 100 mL IVPB     2 g 200 mL/hr over 30 Minutes Intravenous Every 12 hours 09/21/19 1022 09/26/19 0118        Subjective: Patient awake alert.  No complaints offered.  Objective: Vitals:   10/02/19 0000 10/02/19 0400 10/02/19 0430 10/02/19 0822  BP: 107/61 96/70  (!) 120/50  Pulse: 86 89  81  Resp: 16 18    Temp:   98.1 F (36.7 C) 98 F (36.7 C)  TempSrc:   Oral Oral  SpO2: 95% 94%    Weight:      Height:        Intake/Output Summary (Last 24 hours) at 10/02/2019 1055 Last data filed at 10/02/2019 0900 Gross per 24 hour  Intake 240 ml  Output -  Net 240 ml   Filed Weights   09/27/19 0447 09/28/19 0419  09/30/19 0438  Weight: 57.5 kg 70.8 kg 56.8 kg    Examination:   General appearance: Awake alert.  In no distress Resp: Normal effort.  Coarse breath sound bilaterally.  Improved air entry.  No wheezing or rhonchi.  Cardio: S1-S2 is normal regular.  No S3-S4.  No rubs murmurs or bruit GI: Abdomen is soft.  Nontender nondistended.  Bowel sounds are present normal.  No masses organomegaly Extremities: No edema.  Full range of motion of lower extremities. Neurologic: No focal neurological deficits.     Data Reviewed: I have personally reviewed following labs and imaging studies  CBC: Recent Labs  Lab 09/28/19 0015 09/29/19 0411 09/30/19 0619 10/01/19 0415  WBC 9.0 7.7 7.8 7.2  HGB 10.9* 10.6* 11.1* 10.7*  HCT 34.1* 33.3* 34.6* 34.2*  MCV 93.2 94.9 94.5 96.6  PLT 218 227 278 [id number]   Basic Metabolic Panel: Recent Labs  Lab 09/28/19 0015 09/29/19 0411 09/30/19 0619 10/01/19 0415 10/02/19 0515  NA 138 138 136 137 136  K 3.9 3.2* 3.5 4.5 4.1  CL 97* 98 99 102 106  CO2 27 23 25 24  20*  GLUCOSE 103* 105* 107* 80  87  BUN 38* 41* 42* 36* 33*  CREATININE 1.21* 1.38* 1.38* 1.26* 1.14*  CALCIUM 9.5 8.6* 8.7* 8.7* 8.4*  MG 1.9  --   --   --   --    GFR: Estimated Creatinine Clearance: 35.3 mL/min (A) (by C-G formula based on SCr of 1.14 mg/dL (H)). Liver Function Tests: Recent Labs  Lab 09/28/19 0015 09/29/19 0411 09/30/19 0619 10/01/19 0415 10/02/19 0515  AST 30 27 26 26 24   ALT 28 26 25 25 20   ALKPHOS 57 55 57 56 52  BILITOT 0.8 0.8 0.9 0.7 0.7  PROT 6.3* 5.9* 6.3* 6.2* 5.5*  ALBUMIN 2.3* 2.3* 2.6* 2.6* 2.3*   CBG: Recent Labs  Lab 10/01/19 0738 10/01/19 1133 10/01/19 1709 10/01/19 1959 10/02/19 0819  GLUCAP 86 68* 96 106* 76     Radiology Studies: No results found.     Scheduled Meds: . aspirin EC  81 mg Oral Daily  . Chlorhexidine Gluconate Cloth  6 each Topical Daily  . cilostazol  50 mg Oral BID  . DULoxetine  60 mg Oral BID  . feeding  supplement (ENSURE ENLIVE)  237 mL Oral TID BM  . ferrous sulfate  325 mg Oral BID  . mouth rinse  15 mL Mouth Rinse BID  . metoprolol tartrate  25 mg Oral BID  . multivitamin with minerals  1 tablet Oral Daily  . QUEtiapine  50 mg Oral BID  . rivaroxaban  10 mg Oral Daily  . senna-docusate  3 tablet Oral BID  . simvastatin  20 mg Oral QHS  . vitamin C  500 mg Per Tube Daily  . zinc sulfate  220 mg Per Tube Daily   Continuous Infusions:    LOS: 12 days     Bonnielee Haff, MD Pager: [email or web address]

## 2019-10-02 NOTE — Progress Notes (Signed)
Dr. Maryland Pink notified about patient being very agitated and anxious and increased oxygen requirement of 6L/min. I gave patient Haldol and will continue to monitor her.

## 2019-10-03 ENCOUNTER — Inpatient Hospital Stay (HOSPITAL_COMMUNITY): Payer: Medicare Other

## 2019-10-03 LAB — GLUCOSE, CAPILLARY
Glucose-Capillary: 88 mg/dL (ref 70–99)
Glucose-Capillary: 107 mg/dL — ABNORMAL HIGH (ref 70–99)
Glucose-Capillary: 92 mg/dL (ref 70–99)
Glucose-Capillary: 95 mg/dL (ref 70–99)

## 2019-10-03 MED ORDER — QUETIAPINE FUMARATE 25 MG PO TABS
25.0000 mg | ORAL_TABLET | Freq: Once | ORAL | Status: AC
  Administered 2019-10-03: 12:00:00 25 mg via ORAL
  Filled 2019-10-03: qty 1

## 2019-10-03 MED ORDER — QUETIAPINE FUMARATE 25 MG PO TABS
75.0000 mg | ORAL_TABLET | Freq: Two times a day (BID) | ORAL | Status: DC
  Administered 2019-10-03 – 2019-10-07 (×8): 75 mg via ORAL
  Filled 2019-10-03 (×10): qty 3

## 2019-10-03 NOTE — Progress Notes (Signed)
Nutrition Follow up  RD working remotely.  DOCUMENTATION CODES:   Not applicable  INTERVENTION:   Continue PO supplements:   Ensure Enlive po TID, each supplement provides 350 kcal and 20 grams of protein  Magic cup BID with meals, each supplement provides 290 kcal and 9 grams of protein  Vital Cuisine Shake with breakfast daily, each supplement provides 520 kcal and 22 grams of protein   MVI daily  NUTRITION DIAGNOSIS:   Increased nutrient needs related to acute illness(COVID-19) as evidenced by estimated needs.  Ongoing  GOAL:   Patient will meet greater than or equal to 90% of their needs   Progressing   MONITOR:   PO intake, Supplement acceptance, Skin, Labs  ASSESSMENT:   78 yo female admitted to [place] from [facility] on 12/2 with ongoing respiratory distress and for further management by pulmonary critical care. COVID positive on 11/22. Intubated on 11/30. PMH includes breast cancer, former smoker, HTN, CKD-3, DVT.   Repeat COVID test positive on 12/12. Awaiting admission to CIR.  Patient remains on a DYS 3 with thin liquids diet. Meal completions charted as 25-50% for the last 4 days.  She has also been drinking Ensure Enlive 2-3 times daily.  Medications reviewed and include ferrous sulfate, MVI, senokot-s, vitamin C, zinc sulfate.  Labs reviewed.  I/O -3 L since admission Weight down 17.2 kg since last week. Question accuracy of weights.  Diet Order:   Diet Order            DIET DYS 3 Room service appropriate? Yes; Fluid consistency: Thin  Diet effective now              EDUCATION NEEDS:   Not appropriate for education at this time  Skin:  Reviewed RN Assessment  Last BM:  12/14  Height:   Ht Readings from Last 1 Encounters:  09/20/19 5\' 7"  (1.702 m)    Weight:   Wt Readings from Last 1 Encounters:  09/30/19 56.8 kg   09/26/19 74 kg  Ideal Body Weight:  61.4 kg  BMI:  Body mass index is 19.63 kg/m.  Estimated  Nutritional Needs:   Kcal:  1850-2100  Protein:  110-130 gm  Fluid:  >/= 1.9 L   Molli Barrows, RD, LDN, [place] Pager [phone] After Hours Pager [phone]

## 2019-10-03 NOTE — Progress Notes (Signed)
PROGRESS NOTE    Charde Renslow  [id number] DOB: 05-14-1941 DOA: 09/20/2019 PCP: System, Pcp Not In    Brief Narrative:  78 year old female who presented with dyspnea.  She was admitted to [facility] November 21 in the setting of mild hypoxemia and diarrhea.  Her COVID-19 test came back positive November 22.  Patient developed progressive hypoxic respiratory failure despite 10 days of dexamethasone and 5 days of remdesivir. She was intubated November 30, transferred to [facility] December 2.  Patient received mechanical ventilation per ARDS-net protocol, with good toleration.  Patient was liberated from mechanical ventilation on December 4.   Her hospitalization was been complicated with bacterial pneumonia  coinfection requiring antibiotic therapy, acute focal neurologic deficit with slurred speech and facial droop (negative MRI for acute CVA ) and acute on chronic systolic heart failure.    Assessment & Plan:  Acute hypoxic respiratory failure due to SARS COVID-19 viral pneumonia/secondary bacterial infection Patient was initially intubated.  She was successfully extubated on December 4.  Patient completed treatment with remdesivir and steroids.  She also completed a course of antibacterials.   Patient was initially requiring high flow nasal cannula at 15 L/min.  She had improved and was weaned down to 2 L of oxygen over the last few days. Yesterday evening there was some issue with agitation and the nursing staff reported low oxygen saturations.  She had to be bumped up to 6 L.  Noted to be stable on 3 L this morning.  Continue to wean down.   Chest x-ray done this morning shows stable findings for the most part. Patient was also given diuretics which seems to have helped.  Held due to rising BUN and creatinine. Volume status seems to be stable.  Continue to hold.   Continue with incentive spirometry, mobilization, bronchodilators.  PT and OT evaluation.  CIR is  recommended.  However her repeat COVID-19 test is also come back positive.  Unsure what the CIR policies are now. Vernard Gambles remains stable from a respiratory standpoint.  Acute metabolic encephalopathy According to patient's sister patient is usually quite alert and oriented at baseline.  There is no history of dementia.  MRI brain did show brain atrophy.  She could have mild cognitive impairment which was not evident previously.  Due to acute hospitalization and acute illness this may have manifested.   Patient continuing to experience episodes of agitation occasionally.  We will further increase the dose of Seroquel.  Does not have any focal deficits.  Suspected TIA Apparently there was some concern for slurred speech and facial droop.  Patient underwent MRI of the brain on 12/7 which did not show any acute findings.  However it was a motion degraded study.  No focal deficits present.  Continue aspirin and statin.  Dysphagia diet.  Aspiration precautions.    Essential hypertension Continue to monitor blood pressures.  Noted to be on metoprolol.  Chronic systolic CHF (EF [id number]) Seems to be euvolemic.  Continue beta-blockers and statins.  She was given Lasix intermittently.  Currently on hold.  Continue to monitor.  Dyslipidemia Continue with statin therapy.   History of PE/DVT Back on her rivaroxaban.    History of coronary artery disease Stable.  No chest pain.  Continue with home medications.   Severe peripheral vascular disease and carotid artery disease Continue with statin and asa and cilostazol. Follow up as outpatient.   Diabetes mellitus type 2 patient has a hypoglycemia Due to low normal glucose levels sliding  scale coverage was discontinued.  HbA1c 6.1.  CBGs are on the low side but stable.  Continue to encourage oral intake.  CKD stage 3b.  Renal function has been stable the last several days.  Monitor urine output.  Hypokalemia Repleted  Normocytic anemia No evidence for  overt bleeding.  Hemoglobin has been stable.   DVT prophylaxis: Rivaroxaban Code Status: Limited Code Family Communication: Sister being updated on a daily basis Disposition Plan: Waiting for input from inpatient rehabilitation.  The repeat COVID-19 test report is also positive.  Await further input from CIR.   Consultants:  Neurology   Procedures:   Transthoracic echocardiogram 09/20/2019 IMPRESSIONS    1. Technically difficult study. Left ventricular ejection fraction, by visual estimation, is 35 to 40%. The left ventricle has moderately decreased function. There is mildly increased left ventricular hypertrophy. Inferior/septal akinesis  2. Left ventricular diastolic parameters are consistent with Grade I diastolic dysfunction (impaired relaxation).  3. Global right ventricle has normal systolic function. The right ventricular size is normal.  4. Left atrial size was normal.  5. Right atrial size was not well visualized.  6. The mitral valve is normal in structure. Trace mitral valve regurgitation.  7. The tricuspid valve is not well visualized. Tricuspid valve regurgitation is trivial.  8. The aortic valve was not well visualized. Aortic valve regurgitation is mild. No evidence of aortic valve stenosis.  9. The pulmonic valve was not well visualized. Pulmonic valve regurgitation is not visualized.   Antimicrobials:  Anti-infectives (From admission, onward)   Start     Dose/Rate Route Frequency Ordered Stop   09/21/19 1100  ceFEPIme (MAXIPIME) 2 g in sodium chloride 0.9 % 100 mL IVPB     2 g 200 mL/hr over 30 Minutes Intravenous Every 12 hours 09/21/19 1022 09/26/19 0118        Subjective: Patient pleasantly confused.  Denies any complaints.  Objective: Vitals:   10/02/19 2000 10/03/19 0022 10/03/19 0420 10/03/19 0743  BP:  (!) 129/59 (!) 90/53 101/78  Pulse: (!) 101 (!) 101 94 92  Resp: 16 19 19 18   Temp:  97.9 F (36.6 C) 97.7 F (36.5 C) 98 F (36.7 C)    TempSrc:  Oral Oral Oral  SpO2: 96% 92% 98% 96%  Weight:      Height:        Intake/Output Summary (Last 24 hours) at 10/03/2019 1107 Last data filed at 10/03/2019 0943 Gross per 24 hour  Intake 720 ml  Output --  Net 720 ml   Filed Weights   09/27/19 0447 09/28/19 0419 09/30/19 0438  Weight: 57.5 kg 70.8 kg 56.8 kg    Examination:   General appearance: Awake alert.  In no distress.  Mildly distracted Resp: Coarse breath sound bilaterally.  No wheezing rales or rhonchi. Cardio: S1-S2 is normal regular.  No S3-S4.  No rubs murmurs or bruit GI: Abdomen is soft.  Nontender nondistended.  Bowel sounds are present normal.  No masses organomegaly Extremities: No edema.  Moving all her extremities. Neurologic:   No focal neurological deficits.      Data Reviewed: I have personally reviewed following labs and imaging studies  CBC: Recent Labs  Lab 09/28/19 0015 09/29/19 0411 09/30/19 0619 10/01/19 0415 10/02/19 1040  WBC 9.0 7.7 7.8 7.2 9.9  HGB 10.9* 10.6* 11.1* 10.7* 11.8*  HCT 34.1* 33.3* 34.6* 34.2* 38.3  MCV 93.2 94.9 94.5 96.6 97.0  PLT 218 227 278 256 [id number]   Basic Metabolic Panel: Recent  Labs  Lab 09/28/19 0015 09/29/19 0411 09/30/19 0619 10/01/19 0415 10/02/19 0515  NA 138 138 136 137 136  K 3.9 3.2* 3.5 4.5 4.1  CL 97* 98 99 102 106  CO2 27 23 25 24  20*  GLUCOSE 103* 105* 107* 80 87  BUN 38* 41* 42* 36* 33*  CREATININE 1.21* 1.38* 1.38* 1.26* 1.14*  CALCIUM 9.5 8.6* 8.7* 8.7* 8.4*  MG 1.9  --   --   --   --    GFR: Estimated Creatinine Clearance: 35.3 mL/min (A) (by C-G formula based on SCr of 1.14 mg/dL (H)). Liver Function Tests: Recent Labs  Lab 09/28/19 0015 09/29/19 0411 09/30/19 0619 10/01/19 0415 10/02/19 0515  AST 30 27 26 26 24   ALT 28 26 25 25 20   ALKPHOS 57 55 57 56 52  BILITOT 0.8 0.8 0.9 0.7 0.7  PROT 6.3* 5.9* 6.3* 6.2* 5.5*  ALBUMIN 2.3* 2.3* 2.6* 2.6* 2.3*   CBG: Recent Labs  Lab 10/02/19 0819 10/02/19 1110  10/02/19 1555 10/02/19 1953 10/03/19 0741  GLUCAP 76 125* 93 99 88     Radiology Studies: DG CHEST PORT 1 VIEW  Result Date: 10/03/2019 CLINICAL DATA:  Acute respiratory disease, COVID positive EXAM: PORTABLE CHEST 1 VIEW COMPARISON:  09/28/2019 FINDINGS: Right PICC line tip SVC RA junction level. Stable heart size and vascularity. Negative for edema or effusion. No pneumothorax. Given changes in technique, stable patchy mixed airspace and interstitial opacities throughout both lungs, slightly worse on the right. Trachea midline. Aorta atherosclerotic. Remote left mastectomy and axillary lymph node dissection. Degenerative changes of the spine with a mild scoliosis. IMPRESSION: Stable patchy bilateral airspace and interstitial opacities, worse on the right. No significant interval change. Electronically Signed   By: Jerilynn Mages.  Shick M.D.   On: 10/03/2019 08:12       Scheduled Meds: . aspirin EC  81 mg Oral Daily  . Chlorhexidine Gluconate Cloth  6 each Topical Daily  . cilostazol  50 mg Oral BID  . DULoxetine  60 mg Oral BID  . feeding supplement (ENSURE ENLIVE)  237 mL Oral TID BM  . ferrous sulfate  325 mg Oral BID  . mouth rinse  15 mL Mouth Rinse BID  . metoprolol tartrate  25 mg Oral BID  . multivitamin with minerals  1 tablet Oral Daily  . QUEtiapine  50 mg Oral BID  . rivaroxaban  10 mg Oral Daily  . senna-docusate  3 tablet Oral BID  . simvastatin  20 mg Oral QHS  . vitamin C  500 mg Per Tube Daily  . zinc sulfate  220 mg Per Tube Daily   Continuous Infusions:    LOS: 13 days     Bonnielee Haff, MD Pager: [email or web address]

## 2019-10-04 LAB — CBC
HCT: 32.6 % — ABNORMAL LOW (ref 36.0–46.0)
Hemoglobin: 10.2 g/dL — ABNORMAL LOW (ref 12.0–15.0)
MCH: 29.9 pg (ref 26.0–34.0)
MCHC: 31.3 g/dL (ref 30.0–36.0)
MCV: 95.6 fL (ref 80.0–100.0)
Platelets: 253 10*3/uL (ref 150–400)
RBC: 3.41 MIL/uL — ABNORMAL LOW (ref 3.87–5.11)
RDW: 15.4 % (ref 11.5–15.5)
WBC: 5.3 10*3/uL (ref 4.0–10.5)
nRBC: 0 % (ref 0.0–0.2)

## 2019-10-04 LAB — BASIC METABOLIC PANEL
Anion gap: 10 (ref 5–15)
BUN: 27 mg/dL — ABNORMAL HIGH (ref 8–23)
CO2: 23 mmol/L (ref 22–32)
Calcium: 8.8 mg/dL — ABNORMAL LOW (ref 8.9–10.3)
Chloride: 105 mmol/L (ref 98–111)
Creatinine, Ser: 1.14 mg/dL — ABNORMAL HIGH (ref 0.44–1.00)
GFR calc Af Amer: 53 mL/min — ABNORMAL LOW (ref >60)
GFR calc non Af Amer: 45 mL/min — ABNORMAL LOW (ref >60)
Glucose, Bld: 93 mg/dL (ref 70–99)
Potassium: 3.4 mmol/L — ABNORMAL LOW (ref 3.5–5.1)
Sodium: 138 mmol/L (ref 135–145)

## 2019-10-04 LAB — GLUCOSE, CAPILLARY
Glucose-Capillary: 87 mg/dL (ref 70–99)
Glucose-Capillary: 168 mg/dL — ABNORMAL HIGH (ref 70–99)
Glucose-Capillary: 106 mg/dL — ABNORMAL HIGH (ref 70–99)
Glucose-Capillary: 109 mg/dL — ABNORMAL HIGH (ref 70–99)

## 2019-10-04 MED ORDER — POTASSIUM CHLORIDE CRYS ER 20 MEQ PO TBCR
40.0000 meq | EXTENDED_RELEASE_TABLET | Freq: Once | ORAL | Status: AC
  Administered 2019-10-04: 10:00:00 40 meq via ORAL
  Filled 2019-10-04: qty 2

## 2019-10-04 MED ORDER — CLONAZEPAM 0.5 MG PO TABS
0.5000 mg | ORAL_TABLET | Freq: Two times a day (BID) | ORAL | Status: DC | PRN
  Administered 2019-10-04 (×2): 0.5 mg via ORAL
  Filled 2019-10-04 (×3): qty 1

## 2019-10-04 MED ORDER — ALTEPLASE 2 MG IJ SOLR
2.0000 mg | Freq: Once | INTRAMUSCULAR | Status: DC
  Filled 2019-10-04: qty 2

## 2019-10-04 NOTE — Progress Notes (Addendum)
Inpatient Rehab Admissions:  I was able to speak with pt's sister, Izora Gala, and their friend, Priscille Heidelberg.  Plan for pt to d/c home from rehab with Romie Minus to pt's home.  Romie Minus is comfortable providing 24/7 supervision only, which I think is reasonable given pt's progress with mobility.  Will open insurance for potential admission Friday pending approval and bed availability.   Note: Most recent OT note from 12/9, will need updated note to pursue insurance authorization.   Signed: Shann Medal, PT, DPT Admissions Coordinator [phone] 10/04/19  12:11 PM

## 2019-10-04 NOTE — Progress Notes (Signed)
Pt continue to be verbally aggressive and constantly yelling out, prn Clonazepam 0.25mg  ordered some effect noted, chair belt with alarm while in the recliner, pt able to remove and attempt to get out of thwe chair, pt returned to bed with bed alarm and pad activated, pt gets out of bed to [facility], order for telesitter given. Pt on O2@1 .5L/min sating @ 93% no resp distress noted, poor po intake, drinking H2O throughout the shift. Will cont to monitor.

## 2019-10-04 NOTE — Progress Notes (Signed)
Occupational Therapy Treatment Patient Details Name: Christie Harper MRN: [id number] DOB: 1941-03-06 Today's Date: 10/04/2019    History of present illness Pt is an 78 y.o. female initially admitted to [facility] on 09/09/19 with mild hypoxemia and diarrhea, (+) COVID-19 test 11/22, treated with Decadron for 10 days, remdesivir 5 days; hypoxemia progressed requiring ETT 11/30-12/4. Head CT negative for acute abnormality; MRI 12/7 with no acute ischemia and severe chronic small vessel disease; pt with continued AMS. 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   OT comments  Pt making excellent progress. Desats with ADL activity but rebounds to 90 on 2L. Pt requires mod A with funcitonal transfers and ADL tasks due to below listed deficits in addition to apparent cognitive deficits. Excellent CIR candidate. Will continue to follow acutely.   Follow Up Recommendations  CIR;Supervision/Assistance - 24 hour    Equipment Recommendations  Other (comment)    Recommendations for Other Services PT consult    Precautions / Restrictions Precautions Precautions: Fall       Mobility Bed Mobility Overal bed mobility: Needs Assistance Bed Mobility: Supine to Sit     Supine to sit: Min guard        Transfers Overall transfer level: Needs assistance   Transfers: Sit to/from Stand;Stand Pivot Transfers Sit to Stand: Min assist Stand pivot transfers: Mod assist       General transfer comment: decreased proprioceptive awareness    Balance     Sitting balance-Leahy Scale: Fair       Standing balance-Leahy Scale: Poor                             ADL either performed or assessed with clinical judgement   ADL   Eating/Feeding: Supervision/ safety;Set up;Sitting    Grooming: Set up;Supervision/safety;Sitting Grooming Details (indicate cue type and reason): cues to wash hands instead of just finger tips after toileting Upper Body Bathing: Minimal assistance;Sitting   Lower Body Bathing: Moderate assistance;Sit to/from stand   Upper Body Dressing : Moderate assistance;Sitting   Lower Body Dressing: Moderate assistance;Sit to/from stand   Toilet Transfer: Moderate assistance;BSC;Stand-pivot Toilet Transfer Details (indicate cue type and reason): pt requires cues to safely complete transfer Toileting- Clothing Manipulation and Hygiene: Minimal assistance       Functional mobility during ADLs: Moderate assistance General ADL Comments: desats with activity to mid 80s     Vision   Additional Comments: will further assess   Perception     Praxis      Cognition Arousal/Alertness: Awake/alert Behavior During Therapy: Restless;Impulsive Overall Cognitive Status: Impaired/Different from baseline Area of Impairment: Orientation;Attention;Memory;Following commands;Safety/judgement;Awareness;Problem solving                 Orientation Level: Disoriented to;Time;Place(knows she is in the hospital but not the name) Current Attention Level: Sustained Memory: Decreased short-term memory;Decreased recall of precautions Following Commands: Follows one step commands consistently Safety/Judgement: Decreased awareness of safety;Decreased awareness of deficits Awareness: Emergent Problem Solving: Slow processing;Difficulty sequencing;Requires tactile cues;Requires verbal cues General Comments: improved since last session        Exercises     Shoulder Instructions       General Comments 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 [employer] Enjoys sports    Pertinent  Vitals/ Pain       Pain Assessment: Faces Faces Pain Scale: No hurt  Home Living                                           Prior Functioning/Environment              Frequency  Min 2X/week        Progress Toward Goals  OT Goals(current goals can now be found in the care plan section)  Progress towards OT goals: Progressing toward goals  Acute Rehab OT Goals Patient Stated Goal: Agreeable to rehab so she can go home OT Goal Formulation: With patient Time For Goal Achievement: 10/07/19 Potential to Achieve Goals: Good ADL Goals Pt Will Perform Grooming: with set-up;with supervision;sitting Pt Will Perform Lower Body Dressing: with min assist;sit to/from stand Pt Will Transfer to Toilet: with min guard assist;bedside commode;ambulating Pt Will Perform Toileting - Clothing Manipulation and hygiene: with min guard assist;sit to/from stand;sitting/lateral leans Additional ADL Goal #1: Pt will demonstrate sustained attention to ADL with 2-3 cues Additional ADL Goal #2: Pt will follow one step commands for ADLs with Min cues  Plan Discharge plan remains appropriate    Co-evaluation                 AM-PAC OT "6 Clicks" Daily Activity     Outcome Measure   Help from another person eating meals?: A Little Help from another person taking care of personal grooming?: A Little Help from another person toileting, which includes using toliet, bedpan, or urinal?: A Lot Help from another person bathing (including washing, rinsing, drying)?: A Lot Help from another person to put on and taking off regular upper body clothing?: A Little Help from another person to put on and taking off regular lower body clothing?: A Lot 6 Click Score: 15    End of Session Equipment Utilized During Treatment: Oxygen  OT Visit Diagnosis: Unsteadiness on feet (R26.81);Other abnormalities of gait and mobility (R26.89);Muscle weakness (generalized) (M62.81)   Activity Tolerance Patient tolerated treatment well   Patient Left in chair;with call bell/phone within reach;with chair alarm set;with nursing/sitter in  room   Nurse Communication Mobility status;Other (comment)(use of chair alarm and telesitter to increase mobility )        Time: 1510-1539 OT Time Calculation (min): 29 min  Charges: OT General Charges $OT Visit: 1 Visit OT Treatments $Self Care/Home Management : 23-37 mins  Maurie Boettcher, OT/L   Acute OT Clinical Specialist [place] Pager [phone] Office [phone]    [facility] 10/04/2019, 4:02 PM

## 2019-10-04 NOTE — TOC Initial Note (Signed)
Transition of Care [facility]) - Initial/Assessment Note    Patient Details  Name: Christie Harper MRN: [id number] Date of Birth: 03-28-1941  Transition of Care [facility]) CM/SW Contact:    Shade Flood, LCSW Phone Number: 10/04/2019, 11:22 AM  Clinical Narrative:                  Pt is from home where she was previously independent in ADLs. Pt was being considered by CIR but her repeat Covid test came back positive and she is not currently a candidate for CIR. Per MD, pt medically stable for dc. TOC pursuing SNF rehab for pt.   RN notes indicate pt only oriented to self. TOC spoke with pt's sister by phone to update. Sister is in agreement with referrals out to SNFs that are accepting covid positive patients.   Will refer out and start insurance authorization.   Expected Discharge Plan: Skilled Nursing Facility Barriers to Discharge: SNF Pending bed offer, Insurance Authorization   Patient Goals and CMS Choice   CMS Medicare.gov Compare Post Acute Care list provided to:: Patient Represenative (must comment) Choice offered to / list presented to : Sibling  Expected Discharge Plan and Services Expected Discharge Plan: [place] In-house Referral: Clinical Social Work   Post Acute Care Choice: [place] Living arrangements for the past 2 months: [place]                                      Prior Living Arrangements/Services Living arrangements for the past 2 months: Single Family Home Lives with:: Self Patient language and need for interpreter reviewed:: Yes Do you feel safe going back to the place where you live?: Yes      Need for Family Participation in Patient Care: No (Comment) Care giver support system in place?: No (comment)   Criminal Activity/Legal Involvement Pertinent to Current Situation/Hospitalization: No - Comment as needed  Activities of Daily Living Home Assistive Devices/Equipment: None ADL Screening (condition  at time of admission) Patient's cognitive ability adequate to safely complete daily activities?: Yes Is the patient deaf or have difficulty hearing?: No Does the patient have difficulty seeing, even when wearing glasses/contacts?: No Does the patient have difficulty concentrating, remembering, or making decisions?: No Patient able to express need for assistance with ADLs?: Yes Does the patient have difficulty dressing or bathing?: No Independently performs ADLs?: Yes (appropriate for developmental age) Does the patient have difficulty walking or climbing stairs?: No Weakness of Legs: None Weakness of Arms/Hands: None  Permission Sought/Granted Permission sought to share information with : Facility Art therapist granted to share information with : Yes, Verbal Permission Granted     Permission granted to share info w AGENCY: SNFs accepting COVID patients        Emotional Assessment Appearance:: Appears stated age Attitude/Demeanor/Rapport: Unable to Assess Affect (typically observed): Unable to Assess Orientation: : Oriented to Self Alcohol / Substance Use: Not Applicable Psych Involvement: No (comment)  Admission diagnosis:  COVID 19 RESP. FAILURE Patient Active Problem List   Diagnosis Date Noted  . Acute respiratory disease due to COVID-19 virus 09/20/2019  . Peripheral arterial disease ([place])   . Hypertension   . Former smoker   . Carotid artery disease ([place])   . Cardiomyopathy ([place])   . Breast cancer ([place])    PCP:  System, Pcp Not In Pharmacy:   [facility], [place] - [address] AT [address] and [place] [address] Phone: [phone] Fax: [phone]     Social Determinants of Health (SDOH) Interventions    Readmission Risk Interventions No flowsheet data found.

## 2019-10-04 NOTE — TOC Progression Note (Signed)
Transition of Care [facility]) - Progression Note    Patient Details  Name: Christie Harper MRN: [id number] Date of Birth: 1940/10/31  [facility]) CM/SW Contact  Shade Flood, LCSW Phone Number: 10/04/2019, 12:09 PM  Clinical Narrative:     Damaris Schooner with [facility] in [place] and she is able to accept pt if insurance approves. Had started SNF auth with [employer] so called and cancelled. [facility] will call [employer] for CIR auth.   TOC will follow up if CIR Josem Kaufmann is denied. [facility] spoke with pt's sister.   Expected Discharge Plan: Skilled Nursing Facility Barriers to Discharge: SNF Pending bed offer, Insurance Authorization  Expected Discharge Plan and Services Expected Discharge Plan: [place] In-house Referral: Clinical Social Work   Post Acute Care Choice: [place] Living arrangements for the past 2 months: Single Family Home                                       Social Determinants of Health (SDOH) Interventions    Readmission Risk Interventions No flowsheet data found.

## 2019-10-04 NOTE — NC FL2 (Signed)
[place] LEVEL OF CARE SCREENING TOOL     IDENTIFICATION  Patient Name: Christie Harper Birthdate: [birth year] Sex: female Admission Date (Current Location): 09/20/2019  [facility] and [place] Number:  Herbalist and Address:  [facility], [place], [place] [address]([facility])      Provider Number: [id number]  Attending Physician Name and Address:  Jonetta Osgood, MD  Relative Name and Phone Number:       Current Level of Care: Hospital Recommended Level of Care: [place] Prior Approval Number:    Date Approved/Denied:   PASRR Number: [id number]  Discharge Plan: SNF    Current Diagnoses: Patient Active Problem List   Diagnosis Date Noted  . Acute respiratory disease due to COVID-19 virus 09/20/2019  . Peripheral arterial disease ([place])   . Hypertension   . Former smoker   . Carotid artery disease ([place])   . Cardiomyopathy ([place])   . Breast cancer ([place])     Orientation RESPIRATION BLADDER Height & Weight     Self  O2 Incontinent Weight: 132 lb 4.8 oz (60 kg) Height:  5\' 7"  (170.2 cm)  BEHAVIORAL SYMPTOMS/MOOD NEUROLOGICAL BOWEL NUTRITION STATUS      Incontinent Diet(see dc summary)  AMBULATORY STATUS COMMUNICATION OF NEEDS Skin   Extensive Assist Verbally PU Stage and Appropriate Care                       Personal Care Assistance Level of Assistance  Bathing, Feeding, Dressing Bathing Assistance: Limited assistance Feeding assistance: Limited assistance Dressing Assistance: Limited assistance     Functional Limitations Info  Sight, Hearing, Speech Sight Info: Adequate Hearing Info: Adequate Speech Info: Adequate    SPECIAL CARE FACTORS FREQUENCY  PT (By licensed PT), OT (By licensed OT)     PT Frequency: 5 times week OT Frequency: 3 times week            Contractures Contractures Info: Not present    Additional Factors Info  Code Status,  Allergies, Psychotropic Code Status Info: Partial Allergies Info: NKA Psychotropic Info: Cymbalta, Seroquel         Current Medications (10/04/2019):  This is the current hospital active medication list Current Facility-Administered Medications  Medication Dose Route Frequency Provider Last Rate Last Admin  . acetaminophen (TYLENOL) tablet 650 mg  650 mg Oral Q6H PRN Bonnielee Haff, MD   650 mg at 10/01/19 1834  . aspirin EC tablet 81 mg  81 mg Oral Daily Elgergawy, Silver Huguenin, MD   81 mg at 10/04/19 0949  . Chlorhexidine Gluconate Cloth 2 % PADS 6 each  6 each Topical Daily Juanito Doom, MD   6 each at 10/04/19 [phone]  . cilostazol (PLETAL) tablet 50 mg  50 mg Oral BID Vianne Bulls, MD   50 mg at 10/04/19 0953  . clonazePAM (KLONOPIN) tablet 0.5 mg  0.5 mg Oral BID PRN Jonetta Osgood, MD      . DULoxetine (CYMBALTA) DR capsule 60 mg  60 mg Oral BID Bonnielee Haff, MD   60 mg at 10/04/19 0953  . feeding supplement (ENSURE ENLIVE) (ENSURE ENLIVE) liquid 237 mL  237 mL Oral TID BM Bonnielee Haff, MD   237 mL at 10/04/19 0954  . ferrous sulfate tablet 325 mg  325 mg Oral BID Bonnielee Haff, MD   325 mg at 10/04/19 0949  . haloperidol lactate (HALDOL) injection 2 mg  2 mg Intravenous Q6H PRN Bonnielee Haff, MD   2 mg at 10/03/19 1955  . MEDLINE mouth rinse  15 mL Mouth Rinse BID Elgergawy, Silver Huguenin, MD   15 mL at 10/03/19 2042  . metoprolol tartrate (LOPRESSOR) injection 5 mg  5 mg Intravenous Q6H PRN Simonne Maffucci B, MD   5 mg at 09/28/19 2210  . metoprolol tartrate (LOPRESSOR) tablet 25 mg  25 mg Oral BID Bonnielee Haff, MD   25 mg at 10/04/19 0949  . multivitamin with minerals tablet 1 tablet  1 tablet Oral Daily Arrien, Jimmy Picket, MD   1 tablet at 10/04/19 [phone]  . ondansetron (ZOFRAN) injection 4 mg  4 mg Intravenous Q6H PRN Juanito Doom, MD   4 mg at 09/30/19 1834  . QUEtiapine (SEROQUEL) tablet 75 mg  75 mg Oral BID Bonnielee Haff, MD   75 mg at 10/04/19 0949   . rivaroxaban (XARELTO) tablet 10 mg  10 mg Oral Daily Bonnielee Haff, MD   10 mg at 10/04/19 0953  . senna-docusate (Senokot-S) tablet 3 tablet  3 tablet Oral BID Elgergawy, Silver Huguenin, MD   3 tablet at 10/04/19 [phone]  . simvastatin (ZOCOR) tablet 20 mg  20 mg Oral QHS Opyd, Ilene Qua, MD   20 mg at 10/03/19 2042  . vitamin C (ASCORBIC ACID) tablet 500 mg  500 mg Per Tube Daily Opyd, Ilene Qua, MD   500 mg at 10/04/19 0949  . zinc sulfate capsule 220 mg  220 mg Per Tube Daily Opyd, Ilene Qua, MD   220 mg at 10/04/19 [id number]     Discharge Medications: Please see discharge summary for a list of discharge medications.  Relevant Imaging Results:  Relevant Lab Results:   Additional Information SSN: [id number] LCSW

## 2019-10-04 NOTE — Progress Notes (Signed)
PROGRESS NOTE                                                                                                                                                                                                             Patient Demographics:    Christie Harper, is a 78 y.o. female, DOB - 18-Apr-1941, [id number]  Outpatient Primary MD for the patient is System, Pcp Not In   Admit date - 09/20/2019   LOS - 14  No chief complaint on file.      Brief Narrative: 78 year old female with past medical history of HTN, breast cancer-s/p mastectomy/chemotherapy, CKD stage IIIa, VTE on Xarelto-who presented to [facility] on 11/21 with diarrhea and mild hypoxemia.  She was started on steroids and remdesivir, but continued to develop progressive hypoxemia-and was intubated.  She was subsequently transferred to [facility] managed in the ICU per [facility]-net protocol.  Patient was liberated from the ventilator on 12/4.  She has had a prolonged hospitalization-complicated by bacterial pneumonia, acute focal neurological deficit with slurred speech  (negative MRI for acute CVA ), and acute on chronic systolic heart failure.  See below for further details.  Significant events: 11/21 admit [facility] diarrhea, mild hypoxemia 11/22 COVID test positive, started remdesivir and decadron 11/24 4-8 L O2 11/30 started heated high flow O2, new fever, intubated 12/2 transfer to [facility] 12/4 extubated 12/6 facial droop/slurred speech 12/7 MRI brain: Negative for infarct.    Subjective:    Christie Harper today remains very anxious-she is following commands but at times appears to be slightly confused.   Assessment  & Plan :   Acute hypoxic respiratory failure due to SARS COVID-19 viral pneumonia/secondary bacterial infection: Significantly improved-she is on just 1-2 L of oxygen.  She has finished a course of antibiotics,  remdesivir/steroids.  Continue supportive care.  Fever: afebrile  O2 requirements:  SpO2: 93 % O2 Flow Rate (L/min): 1 L/min FiO2 (%): 40 %   COVID-19 Labs: No results for input(s): DDIMER, FERRITIN, LDH, CRP in the last 72 hours.     Component Value Date/Time   BNP 29.1 09/20/2019 1415    No results for input(s): PROCALCITON in the last 168 hours.  Lab Results  Component Value Date   SARSCOV2NAA DETECTED (A) 09/30/2019    11/19 SARS COV 2 > positive (performed at  her PCP office)  COVID-19 Medications: Decadron 11/22> 12/1 (at outside hospital) Remdesivir 11/22> 11/26 (at outside hospital)  Other medications: Diuretics:Euvolemic-no need for lasix Antibiotics: Ceftriaxone 11/24 > 11/28 (at outside hospital) Azithro 11/24 > 11/28 (at outside hospital) Cefepime: 12/3>> 12/7  Prone/Incentive Spirometry: encouraged incentive spirometry use 3-4/hour  DVT Prophylaxis  :  Lovenox   Acute metabolic encephalopathy: Continues to have mild confusion-suspect etiology to be lingering ICU delirium-probably had some mild cognitive dysfunction at baseline.  Continue supportive care with Seroquel.  Appears to be very anxious at times-we will start low-dose Klonopin.  MRI brain without any acute abnormalities  Suspected TIA: Developed transient slurred speech on 12/6-MRI brain did not show any acute findings.  Continue aspirin/statin   Essential hypertension: Controlled-continue metoprolol Continue to monitor blood pressures.  Noted to be on metoprolol.  Chronic systolic CHF (EF [id number] by TTE on 12/2): Euvolemic on exam-Lasix on hold.  Continue beta-blocker.  Reviewed records in care everywhere-she follows with cardiology at [facility] appears she has a prior history of diastolic heart failure.  Patient will need further work-up to be done in the outpatient setting.  Dyslipidemia: Continue statin  History of unprovoked VTE: Continue Xarelto  History of coronary artery  disease: No anginal symptoms: Continue aspirin/statin and beta-blocker.  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  History of PAD-s/p bilateral iliac and femoral stenting: Stable-continue aspirin/statin.  History of breast cancer-s/p modified radical mastectomy 2005-continue outpatient follow-up with oncology at [facility].  Diabetes mellitus type 2 (A1c 6.1): CBG stable-monitor closely.  Due to episodes of hypoglycemia-SSI has been discontinued.   CKD stage 3b: Creatinine close to baseline-follow.   Normocytic anemia: Hemoglobin stable-follow periodically.  Debility/deconditioning: Secondary to acute illness-rehab services recommending CIR.  Evaluation in progress.  Nutrition Problem: Nutrition Problem: Increased nutrient needs Etiology: acute illness(COVID-19) Signs/Symptoms: estimated needs Interventions: Ensure Enlive (each supplement provides 350kcal and 20 grams of protein), Magic cup, Hormel Shake  RN pressure injury documentation: Pressure Injury 09/22/19 Other (Comment) Right;Distal;Lateral dark purple spot r/t ETT (Active)  09/22/19 1400  Location: Other (Comment) (tongue)  Location Orientation: Right;Distal;Lateral  Staging:   Wound Description (Comments): dark purple spot r/t ETT  Present on Admission: No    Consults  :  PCCM  Procedures  :   11/30 ETT >  11/30 R arm PICC >   ABG:    Component Value Date/Time   PHART 7.471 (H) 09/22/2019 1032   PCO2ART 46.1 09/22/2019 1032   PO2ART 63.0 (L) 09/22/2019 1032   HCO3 33.4 (H) 09/22/2019 1032   TCO2 35 (H) 09/22/2019 1032   O2SAT 92.0 09/22/2019 1032    Vent Settings: N/A  Condition - Stable  Family Communication  :  Sister updated over the phone  Code Status :  Partial Diet :  Diet Order            DIET DYS 3 Room service appropriate? Yes; Fluid consistency: Thin  Diet effective now                Disposition Plan  :  Remain hospitalized-plans for CIR on discharge.  Barriers to discharge: Hypoxia requiring O2 supplementation/complete 5 days of IV Remdesivir  Antimicorbials  :    Anti-infectives (From admission, onward)   Start     Dose/Rate Route Frequency Ordered Stop   09/21/19 1100  ceFEPIme (MAXIPIME) 2 g in sodium chloride 0.9 % 100 mL IVPB  2 g 200 mL/hr over 30 Minutes Intravenous Every 12 hours 09/21/19 1022 09/26/19 0118      Inpatient Medications  Scheduled Meds: . aspirin EC  81 mg Oral Daily  . Chlorhexidine Gluconate Cloth  6 each Topical Daily  . cilostazol  50 mg Oral BID  . DULoxetine  60 mg Oral BID  . feeding supplement (ENSURE ENLIVE)  237 mL Oral TID BM  . ferrous sulfate  325 mg Oral BID  . mouth rinse  15 mL Mouth Rinse BID  . metoprolol tartrate  25 mg Oral BID  . multivitamin with minerals  1 tablet Oral Daily  . QUEtiapine  75 mg Oral BID  . rivaroxaban  10 mg Oral Daily  . senna-docusate  3 tablet Oral BID  . simvastatin  20 mg Oral QHS  . vitamin C  500 mg Per Tube Daily  . zinc sulfate  220 mg Per Tube Daily   Continuous Infusions: PRN Meds:.acetaminophen, haloperidol lactate, metoprolol tartrate, ondansetron (ZOFRAN) IV   Time Spent in minutes  25  See all Orders from today for further details   Oren Binet M.D on 10/04/2019 at 8:43 AM  To page go to [email or web address] - use universal password  [facility] -  Office  [phone]    Objective:   Vitals:   10/04/19 0009 10/04/19 0352 10/04/19 0355 10/04/19 0819  BP: (!) 108/56 (!) 111/55  (!) 123/56  Pulse: 86 93  (!) 120  Resp: 17 19  (!) 24  Temp: 98.2 F (36.8 C) 98 F (36.7 C)  97.9 F (36.6 C)  TempSrc: Oral Oral  Oral  SpO2: 92% 91%  93%  Weight:   60 kg   Height:        Wt Readings from Last 3 Encounters:  10/04/19 60 kg     Intake/Output Summary (Last 24 hours) at 10/04/2019 0843 Last data filed at 10/04/2019 0354 Gross per 24 hour  Intake  1040 ml  Output --  Net 1040 ml     Physical Exam Gen Exam:Alert awake-not in any distress HEENT:atraumatic, normocephalic Chest: B/L clear to auscultation anteriorly CVS:S1S2 regular Abdomen:soft non tender, non distended Extremities:no edema Neurology: Non focal Skin: no rash   Data Review:    CBC Recent Labs  Lab 09/29/19 0411 09/30/19 0619 10/01/19 0415 10/02/19 1040 10/04/19 0046  WBC 7.7 7.8 7.2 9.9 5.3  HGB 10.6* 11.1* 10.7* 11.8* 10.2*  HCT 33.3* 34.6* 34.2* 38.3 32.6*  PLT 227 278 256 291 253  MCV 94.9 94.5 96.6 97.0 95.6  MCH 30.2 30.3 30.2 29.9 29.9  MCHC 31.8 32.1 31.3 30.8 31.3  RDW 14.5 14.8 14.9 15.0 15.4    Chemistries  Recent Labs  Lab 09/28/19 0015 09/29/19 0411 09/30/19 0619 10/01/19 0415 10/02/19 0515 10/04/19 0046  NA 138 138 136 137 136 138  K 3.9 3.2* 3.5 4.5 4.1 3.4*  CL 97* 98 99 102 106 105  CO2 27 23 25 24  20* 23  GLUCOSE 103* 105* 107* 80 87 93  BUN 38* 41* 42* 36* 33* 27*  CREATININE 1.21* 1.38* 1.38* 1.26* 1.14* 1.14*  CALCIUM 9.5 8.6* 8.7* 8.7* 8.4* 8.8*  MG 1.9  --   --   --   --   --   AST 30 27 26 26 24   --   ALT 28 26 25 25 20   --   ALKPHOS 57 55 57 56 52  --   BILITOT 0.8 0.8 0.9 0.7 0.7  --    ------------------------------------------------------------------------------------------------------------------  No results for input(s): CHOL, HDL, LDLCALC, TRIG, CHOLHDL, LDLDIRECT in the last 72 hours.  Lab Results  Component Value Date   HGBA1C 6.1 (H) 09/20/2019   ------------------------------------------------------------------------------------------------------------------ No results for input(s): TSH, T4TOTAL, T3FREE, THYROIDAB in the last 72 hours.  Invalid input(s): FREET3 ------------------------------------------------------------------------------------------------------------------ No results for input(s): VITAMINB12, FOLATE, FERRITIN, TIBC, IRON, RETICCTPCT in the last 72 hours.  Coagulation  profile No results for input(s): INR, PROTIME in the last 168 hours.  No results for input(s): DDIMER in the last 72 hours.  Cardiac Enzymes No results for input(s): CKMB, TROPONINI, MYOGLOBIN in the last 168 hours.  Invalid input(s): CK ------------------------------------------------------------------------------------------------------------------    Component Value Date/Time   BNP 29.1 09/20/2019 1415    Micro Results Recent Results (from the past 240 hour(s))  Novel Coronavirus, NAA (Hosp order, Send-out to Ref Lab; TAT 18-24 hrs     Status: Abnormal   Collection Time: 09/30/19  2:38 PM   Specimen: Nasopharyngeal Swab; Respiratory  Result Value Ref Range Status   SARS-CoV-2, NAA DETECTED (A) NOT DETECTED Final    Comment: (NOTE)                  Client Requested Flag This nucleic acid amplification test was developed and its performance characteristics determined by [employer]. Nucleic acid amplification tests include PCR and TMA. This test has not been FDA cleared or approved. This test has been authorized by FDA under an Emergency Use Authorization (EUA). This test is only authorized for the duration of time the declaration that circumstances exist justifying the authorization of the emergency use of in vitro diagnostic tests for detection of SARS-CoV-2 virus and/or diagnosis of COVID-19 infection under section 564(b)(1) of the Act, 21 U.S.C. [id number]) (1), unless the authorization is terminated or revoked sooner. When diagnostic testing is negative, the possibility of a false negative result should be considered in the context of a patient's recent exposures and the presence of clinical signs and symptoms consistent with COVID-19. An individual without symptoms of COVID-  19 and who is not shedding SARS-CoV-2 virus would expect to have a negative (not detected) result in this assay. Performed At: [facility] [place], [place]  [id number] Rush Farmer MD [id number]    [place]  Final    Comment: Performed at [place] [address] [place], [place] [address]    Radiology Reports CT HEAD WO CONTRAST  Result Date: 09/23/2019 CLINICAL DATA:  Acute speech disturbance.  Coronavirus infection. EXAM: CT HEAD WITHOUT CONTRAST TECHNIQUE: Contiguous axial images were obtained from the base of the skull through the vertex without intravenous contrast. COMPARISON:  None. FINDINGS: Brain: Age related atrophy. Chronic small-vessel ischemic change of the pons. No focal cerebellar insult. 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 No sign of acute infarction, mass lesion, hemorrhage, hydrocephalus or extra-axial collection. Vascular: There is atherosclerotic calcification of the major vessels at the base of the brain. Skull: Negative Sinuses/Orbits: Clear/normal Other: None IMPRESSION: No acute finding by CT. 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 No sign of acute insult at this time. Electronically Signed   By: Nelson Chimes M.D.   On: 09/23/2019 09:43   MR ANGIO HEAD WO CONTRAST  Result Date: 09/26/2019 CLINICAL DATA:  Expressive aphasia and facial droop following extubation. EXAM: MRI HEAD WITHOUT CONTRAST MRA HEAD WITHOUT CONTRAST TECHNIQUE:  Multiplanar, multiecho pulse sequences of the brain and surrounding structures were obtained without intravenous contrast. Angiographic images of the head were obtained using MRA technique without contrast. COMPARISON:  None. FINDINGS: The examination had to be discontinued prior to completion due to patient mental status and inability to cooperate with the technologist's instructions. Additionally, the examination is degraded by motion. Nine series are provided for  interpretation. MRI HEAD FINDINGS BRAIN: There is no acute infarct, acute hemorrhage or extra-axial collection. Diffuse confluent hyperintense T2-weighted signal within the periventricular, deep and juxtacortical white matter, most commonly due to chronic ischemic microangiopathy. There is generalized atrophy without lobar predilection. The midline structures are normal. VASCULAR: The major intracranial arterial and venous sinus flow voids are normal. Susceptibility-sensitive sequences show no chronic microhemorrhage or superficial siderosis. SKULL AND UPPER CERVICAL SPINE: Calvarial bone marrow signal is normal. There is no skull base mass. The visualized upper cervical spine and soft tissues are normal. SINUSES/ORBITS: There are no fluid levels or advanced mucosal thickening. The mastoid air cells and middle ear cavities are free of fluid. The orbits are normal. MRA HEAD FINDINGS The MRA portion of the examination is severely motion degraded. There is no antegrade flow demonstrated in the left ICA. The bilateral anterior and middle cerebral arteries are normal. The basilar artery and the posterior cerebral arteries are patent. IMPRESSION: 1. Motion degraded examination with no acute ischemia. 2. 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 3. Severely motion degraded MRA without antegrade flow in the left ICA. By history, the patient has a known left ICA occlusion. 4. MRA of the neck was not performed due to difficulties described above. CTA of the neck may provide better characterization with a shorter imaging time. Electronically Signed   By: Ulyses Jarred M.D.   On: 09/26/2019 00:02   MR BRAIN WO CONTRAST  Result Date: 09/26/2019 CLINICAL DATA:  Expressive aphasia and facial droop following extubation. EXAM: MRI HEAD WITHOUT CONTRAST MRA HEAD WITHOUT CONTRAST TECHNIQUE: Multiplanar, multiecho pulse sequences of the brain and surrounding structures were obtained without intravenous contrast. Angiographic  images of the head were obtained using MRA technique without contrast. COMPARISON:  None. FINDINGS: The examination had to be discontinued prior to completion due to patient mental status and inability to cooperate with the technologist's instructions. Additionally, the examination is degraded by motion. Nine series are provided for interpretation. MRI HEAD FINDINGS BRAIN: There is no acute infarct, acute hemorrhage or extra-axial collection. Diffuse confluent hyperintense T2-weighted signal within the periventricular, deep and juxtacortical white matter, most commonly due to chronic ischemic microangiopathy. There is generalized atrophy without lobar predilection. The midline structures are normal. VASCULAR: The major intracranial arterial and venous sinus flow voids are normal. Susceptibility-sensitive sequences show no chronic microhemorrhage or superficial siderosis. SKULL AND UPPER CERVICAL SPINE: Calvarial bone marrow signal is normal. There is no skull base mass. The visualized upper cervical spine and soft tissues are normal. SINUSES/ORBITS: There are no fluid levels or advanced mucosal thickening. The mastoid air cells and middle ear cavities are free of fluid. The orbits are normal. MRA HEAD FINDINGS The MRA portion of the examination is severely motion degraded. There is no antegrade flow demonstrated in the left ICA. The bilateral anterior and middle cerebral arteries are normal. The basilar artery and the posterior cerebral arteries are patent. IMPRESSION: 1. Motion degraded examination with no acute ischemia. 2. 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 3. Severely motion degraded MRA without antegrade flow in the left ICA. By history, the patient  has a known left ICA occlusion. 4. MRA of the neck was not performed due to difficulties described above. CTA of the neck may provide better characterization with a shorter imaging time. Electronically Signed   By: Ulyses Jarred M.D.   On: 09/26/2019  00:02   DG CHEST PORT 1 VIEW  Result Date: 10/03/2019 CLINICAL DATA:  Acute respiratory disease, COVID positive EXAM: PORTABLE CHEST 1 VIEW COMPARISON:  09/28/2019 FINDINGS: Right PICC line tip SVC RA junction level. Stable heart size and vascularity. Negative for edema or effusion. No pneumothorax. Given changes in technique, stable patchy mixed airspace and interstitial opacities throughout both lungs, slightly worse on the right. Trachea midline. Aorta atherosclerotic. Remote left mastectomy and axillary lymph node dissection. Degenerative changes of the spine with a mild scoliosis. IMPRESSION: Stable patchy bilateral airspace and interstitial opacities, worse on the right. No significant interval change. Electronically Signed   By: Jerilynn Mages.  Shick M.D.   On: 10/03/2019 08:12   DG CHEST PORT 1 VIEW  Result Date: 09/28/2019 CLINICAL DATA:  CHF. EXAM: PORTABLE CHEST 1 VIEW COMPARISON:  09/23/2019.  09/20/2019. FINDINGS: Right PICC line noted with tip over superior vena cava. Stable cardiomegaly. No pulmonary venous congestion. Diffuse bilateral pulmonary infiltrates. Slight worsening from prior exam. No pleural effusion or pneumothorax. Prior left mastectomy. Surgical clips over upper abdomen. Degenerative changes thoracic spine. IMPRESSION: 1.  Right PICC line noted with tip over superior vena cava. 2. Diffuse bilateral pulmonary interstitial infiltrates. Slight worsening from prior exam. Electronically Signed   By: Marcello Moores  Register   On: 09/28/2019 08:15   DG Chest Port 1 View  Result Date: 09/23/2019 CLINICAL DATA:  Hypoxia EXAM: PORTABLE CHEST - 1 VIEW COMPARISON:  09/20/2019 FINDINGS: Patient has been extubated and the nasogastric tube removed. Right arm PICC stable. Persistent interstitial opacities throughout both mid lung fields. Slight worsening of patchy airspace opacities in both lung bases. Heart size stable.  Aortic Atherosclerosis (ICD10-170.0). No definite effusion. No pneumothorax.  Visualized bones unremarkable. IMPRESSION: 1. Slight worsening of patchy airspace opacities in both lung bases. 2. Stable interstitial opacities throughout both lungs. 3. Stable support apparatus. 4. Aortic Atherosclerosis (ICD10-170.0). Electronically Signed   By: Lucrezia Europe M.D.   On: 09/23/2019 14:25   Portable chest 1 View  Result Date: 09/20/2019 CLINICAL DATA:  Shortness of breath EXAM: PORTABLE CHEST 1 VIEW COMPARISON:  None. FINDINGS: Patchy areas of interstitial airspace disease throughout the right lung and to lesser extent left mid lung. No pleural effusion or pneumothorax. Stable cardiomediastinal silhouette. No aggressive osseous lesion. Endotracheal tube with the tip 6 cm above the carina. Right-sided PICC line with the tip projecting over the SVC. Nasogastric tube coursing below the diaphragm. No acute osseous abnormality. IMPRESSION: 1. Patchy areas of interstitial airspace disease throughout the right lung and to lesser extent left mid lung concerning for atypical viral pneumonia. Electronically Signed   By: Kathreen Devoid   On: 09/20/2019 13:45   ECHOCARDIOGRAM COMPLETE  Result Date: 09/21/2019   ECHOCARDIOGRAM REPORT   Patient Name:   MICHAELIA Orcutt Date of Exam: 09/20/2019 Medical Rec #:  [id number]       Height:       67.0 in Accession #:    [id number]      Weight:       163.1 lb Date of Birth:  04/04/1941       BSA:          1.85 m Patient Age:    57 years  BP:           74/50 mmHg Patient Gender: F               HR:           83 bpm. Exam Location:  Inpatient Procedure: 2D Echo Indications:    Dyspnea 786.09 / R06.00  History:        Patient has no prior history of Echocardiogram examinations.                 Risk Factors:Hypertension and Former Smoker. COVID                 pneumoniaChronic kidney disease, carotid stenosis, past history                 of breast cancer, mastectomy and chemotherapy, unprovoked DVT.  Sonographer:    Darlina Sicilian RDCS Referring Phys: [place]  1. Technically difficult study. Left ventricular ejection fraction, by visual estimation, is 35 to 40%. The left ventricle has moderately decreased function. There is mildly increased left ventricular hypertrophy. Inferior/septal akinesis  2. Left ventricular diastolic parameters are consistent with Grade I diastolic dysfunction (impaired relaxation).  3. Global right ventricle has normal systolic function. The right ventricular size is normal.  4. Left atrial size was normal.  5. Right atrial size was not well visualized.  6. The mitral valve is normal in structure. Trace mitral valve regurgitation.  7. The tricuspid valve is not well visualized. Tricuspid valve regurgitation is trivial.  8. The aortic valve was not well visualized. Aortic valve regurgitation is mild. No evidence of aortic valve stenosis.  9. The pulmonic valve was not well visualized. Pulmonic valve regurgitation is not visualized. FINDINGS  Left Ventricle: Left ventricular ejection fraction, by visual estimation, is 35 to 40%. The left ventricle has moderately decreased function. There is mildly increased left ventricular hypertrophy. Left ventricular diastolic parameters are consistent with Grade I diastolic dysfunction (impaired relaxation). Right Ventricle: The right ventricular size is not well visualized. No increase in right ventricular wall thickness. Global RV systolic function is has normal systolic function. Left Atrium: Left atrial size was normal in size. Right Atrium: Right atrial size was not well visualized Pericardium: There is no evidence of pericardial effusion. Presence of pericardial fat pad. Mitral Valve: The mitral valve is normal in structure. Trace mitral valve regurgitation. Tricuspid Valve: The tricuspid valve is not well visualized. Tricuspid valve regurgitation is trivial. Aortic Valve: The aortic valve was not well visualized. Aortic valve regurgitation is mild. The aortic valve is structurally  normal, with no evidence of sclerosis or stenosis. Pulmonic Valve: The pulmonic valve was not well visualized. Pulmonic valve regurgitation is not visualized. Aorta: The aortic root is normal in size and structure. IAS/Shunts: The interatrial septum was not well visualized.  LEFT VENTRICLE PLAX 2D LVOT diam:     2.00 cm       Diastology LVOT Area:     3.14 cm      LV e' lateral:   7.18 cm/s                              LV E/e' lateral: 6.7                              LV e' medial:    4.03 cm/s LV Volumes (MOD)  LV E/e' medial:  11.9 LV area d, A2C:    27.90 cm LV area d, A4C:    30.40 cm LV area s, A2C:    21.60 cm LV area s, A4C:    20.90 cm LV major d, A2C:   8.53 cm LV major d, A4C:   8.04 cm LV major s, A2C:   7.81 cm LV major s, A4C:   7.36 cm LV vol d, MOD A2C: 74.2 ml LV vol d, MOD A4C: 94.4 ml LV vol s, MOD A2C: 51.0 ml LV vol s, MOD A4C: 49.2 ml LV SV MOD A2C:     23.2 ml LV SV MOD A4C:     94.4 ml LV SV MOD BP:      34.7 ml LEFT ATRIUM           Index LA Vol (A2C): 23.2 ml 12.51 ml/m LA Vol (A4C): 20.9 ml 11.27 ml/m  AORTIC VALVE LVOT Vmax:   76.90 cm/s LVOT Vmean:  36.500 cm/s LVOT VTI:    0.076 m MITRAL VALVE MV Area (PHT): 3.08 cm             SHUNTS MV PHT:        71.34 msec           Systemic VTI:  0.08 m MV Decel Time: 246 msec             Systemic Diam: 2.00 cm MV E velocity: 48.00 cm/s 103 cm/s MV A velocity: 96.00 cm/s 70.3 cm/s MV E/A ratio:  0.50       1.5  Oswaldo Milian MD Electronically signed by Oswaldo Milian MD Signature Date/Time: 09/21/2019/12:17:47 AM    Final

## 2019-10-04 NOTE — Plan of Care (Signed)
Pt very agitated, anxious, and restless during the start of the shift. Slept. Unable to respond to verbal redirection, and was asking for something to 'help her relax', prn Haldol given. Pt eventually calmed down and has slept well during the night. No complaints of pain verbalized. Alert and oriented to person and place. Vitals stable, O2 weaned down to 1L [place]. Minimal assist with ADLs. Assistance x1 to the [facility] for toileting needs. PICC line SL. Assisted regular repositioning for pressure relief. No other issues, will monitor.   Problem: Health Behavior/Discharge Planning: Goal: Ability to manage health-related needs will improve Outcome: Progressing   Problem: Education: Goal: Knowledge of General Education information will improve Description: Including pain rating scale, medication(s)/side effects and non-pharmacologic comfort measures Outcome: Progressing   Problem: Coping: Goal: Level of anxiety will decrease Outcome: Progressing

## 2019-10-05 LAB — GLUCOSE, CAPILLARY
Glucose-Capillary: 103 mg/dL — ABNORMAL HIGH (ref 70–99)
Glucose-Capillary: 123 mg/dL — ABNORMAL HIGH (ref 70–99)
Glucose-Capillary: 104 mg/dL — ABNORMAL HIGH (ref 70–99)
Glucose-Capillary: 91 mg/dL (ref 70–99)

## 2019-10-05 LAB — BASIC METABOLIC PANEL
Anion gap: 9 (ref 5–15)
BUN: 24 mg/dL — ABNORMAL HIGH (ref 8–23)
CO2: 25 mmol/L (ref 22–32)
Calcium: 9.4 mg/dL (ref 8.9–10.3)
Chloride: 103 mmol/L (ref 98–111)
Creatinine, Ser: 1.05 mg/dL — ABNORMAL HIGH (ref 0.44–1.00)
GFR calc Af Amer: 58 mL/min — ABNORMAL LOW (ref >60)
GFR calc non Af Amer: 50 mL/min — ABNORMAL LOW (ref >60)
Glucose, Bld: 106 mg/dL — ABNORMAL HIGH (ref 70–99)
Potassium: 4.1 mmol/L (ref 3.5–5.1)
Sodium: 137 mmol/L (ref 135–145)

## 2019-10-05 LAB — MAGNESIUM: Magnesium: 1.8 mg/dL (ref 1.7–2.4)

## 2019-10-05 MED ORDER — CLONAZEPAM 0.5 MG PO TABS
0.5000 mg | ORAL_TABLET | Freq: Three times a day (TID) | ORAL | Status: DC | PRN
  Administered 2019-10-06 – 2019-10-07 (×2): 0.5 mg via ORAL
  Filled 2019-10-05 (×2): qty 1

## 2019-10-05 MED ORDER — MAGNESIUM SULFATE 2 GM/50ML IV SOLN
2.0000 g | Freq: Once | INTRAVENOUS | Status: AC
  Administered 2019-10-05: 2 g via INTRAVENOUS
  Filled 2019-10-05: qty 50

## 2019-10-05 NOTE — Progress Notes (Signed)
PROGRESS NOTE                                                                                                                                                                                                             Patient Demographics:    Christie Harper, is a 78 y.o. female, DOB - 1940-12-30, ZX:[id number]  Outpatient Primary MD for the patient is System, Pcp Not In   Admit date - 09/20/2019   LOS - 15  No chief complaint on file.      Brief Narrative: 78 year old female with past medical history of HTN, breast cancer-s/p mastectomy/chemotherapy, CKD stage IIIa, VTE on Xarelto-who presented to [facility] on 11/21 with diarrhea and mild hypoxemia.  She was started on steroids and remdesivir, but continued to develop progressive hypoxemia-and was intubated.  She was subsequently transferred to [facility] managed in the ICU per [facility]-net protocol.  Patient was liberated from the ventilator on 12/4.  She has had a prolonged hospitalization-complicated by bacterial pneumonia, acute focal neurological deficit with slurred speech  (negative MRI for acute CVA ), and acute on chronic systolic heart failure.  See below for further details.  Significant events: 11/21 admit [facility] diarrhea, mild hypoxemia 11/22 COVID test positive, started remdesivir and decadron 11/24 4-8 L O2 11/30 started heated high flow O2, new fever, intubated 12/2 transfer to [facility] 12/4 extubated 12/6 facial droop/slurred speech 12/7 MRI brain: Negative for infarct.    Subjective:    Christie Harper today seems more alert compared to yesterday.  She was just about to work with physical therapy.    Assessment  & Plan :   Acute hypoxic respiratory failure due to SARS COVID-19 viral pneumonia/secondary bacterial infection: Significantly improved-she is on just 1-2 L of oxygen.  She has finished a course of antibiotics,  remdesivir/steroids.  Continue supportive care.  Fever: afebrile  O2 requirements:  SpO2: 95 % O2 Flow Rate (L/min): 2 L/min FiO2 (%): 40 %   COVID-19 Labs: No results for input(s): DDIMER, FERRITIN, LDH, CRP in the last 72 hours.     Component Value Date/Time   BNP 29.1 09/20/2019 1415    No results for input(s): PROCALCITON in the last 168 hours.  Lab Results  Component Value Date   SARSCOV2NAA DETECTED (A) 09/30/2019    11/19 SARS COV 2 >  positive (performed at her PCP office)  COVID-19 Medications: Decadron 11/22> 12/1 (at outside hospital) Remdesivir 11/22> 11/26 (at outside hospital)  Other medications: Diuretics:Euvolemic-no need for lasix Antibiotics: Ceftriaxone 11/24 > 11/28 (at outside hospital) Azithro 11/24 > 11/28 (at outside hospital) Cefepime: 12/3>> 12/7  Prone/Incentive Spirometry: encouraged incentive spirometry use 3-4/hour  DVT Prophylaxis  :  Lovenox   Acute metabolic encephalopathy: Continues to have mild confusion-suspect etiology to be lingering ICU delirium-probably had some mild cognitive dysfunction at baseline.  Continue supportive care with Seroquel.  Due to anxiety issues-she was started on Klonopin with good response.  MRI brain without any acute abnormalities.  Suspected TIA: Developed transient slurred speech on 12/6-MRI brain did not show any acute findings.  Continue aspirin/statin   Essential hypertension: Controlled-continue metoprolol Continue to monitor blood pressures.  Noted to be on metoprolol.  Chronic systolic CHF (EF [id number] by TTE on 12/2): Euvolemic on exam-Lasix on hold.  Continue beta-blocker.  Reviewed records in care everywhere-she follows with cardiology at [facility] appears she has a prior history of diastolic heart failure.  Patient will need further work-up to be done in the outpatient setting.  Dyslipidemia: Continue statin  History of unprovoked VTE: Continue Xarelto  History of coronary artery  disease: No anginal symptoms: Continue aspirin/statin and beta-blocker.  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  History of PAD-s/p bilateral iliac and femoral stenting: Stable-continue aspirin/statin.  History of breast cancer-s/p modified radical mastectomy 2005-continue outpatient follow-up with oncology at [facility].  Diabetes mellitus type 2 (A1c 6.1): CBG stable-monitor closely.  Due to episodes of hypoglycemia-SSI has been discontinued.   CKD stage 3b: Creatinine close to baseline-follow.   Normocytic anemia: Hemoglobin stable-follow periodically.  Debility/deconditioning: Secondary to acute illness-rehab services recommending CIR-to maximize functional mobility, cognitive status and independence prior to return home.  Evaluation/insurance authorization in progress.  Nutrition Problem: Nutrition Problem: Increased nutrient needs Etiology: acute illness(COVID-19) Signs/Symptoms: estimated needs Interventions: Ensure Enlive (each supplement provides 350kcal and 20 grams of protein), Magic cup, Hormel Shake  RN pressure injury documentation: Pressure Injury 09/22/19 Other (Comment) Right;Distal;Lateral dark purple spot r/t ETT (Active)  09/22/19 1400  Location: Other (Comment) (tongue)  Location Orientation: Right;Distal;Lateral  Staging:   Wound Description (Comments): dark purple spot r/t ETT  Present on Admission: No    Consults  :  PCCM  Procedures  :   11/30 ETT >  11/30 R arm PICC >   ABG:    Component Value Date/Time   PHART 7.471 (H) 09/22/2019 1032   PCO2ART 46.1 09/22/2019 1032   PO2ART 63.0 (L) 09/22/2019 1032   HCO3 33.4 (H) 09/22/2019 1032   TCO2 35 (H) 09/22/2019 1032   O2SAT 92.0 09/22/2019 1032    Vent Settings: N/A  Condition - Stable  Family Communication  :  Sister updated over the phone on 12/16-we will update over the next few days.  Code Status :   Partial Diet :  Diet Order            DIET DYS 3 Room service appropriate? Yes; Fluid consistency: Thin  Diet effective now               Disposition Plan  :  Remain hospitalized-plans for CIR on discharge.  Barriers to discharge: Hypoxia requiring O2 supplementation  Antimicorbials  :    Anti-infectives (From admission, onward)   Start     Dose/Rate Route Frequency Ordered  Stop   09/21/19 1100  ceFEPIme (MAXIPIME) 2 g in sodium chloride 0.9 % 100 mL IVPB     2 g 200 mL/hr over 30 Minutes Intravenous Every 12 hours 09/21/19 1022 09/26/19 0118      Inpatient Medications  Scheduled Meds: . alteplase  2 mg Intracatheter Once  . aspirin EC  81 mg Oral Daily  . Chlorhexidine Gluconate Cloth  6 each Topical Daily  . cilostazol  50 mg Oral BID  . DULoxetine  60 mg Oral BID  . feeding supplement (ENSURE ENLIVE)  237 mL Oral TID BM  . ferrous sulfate  325 mg Oral BID  . mouth rinse  15 mL Mouth Rinse BID  . metoprolol tartrate  25 mg Oral BID  . multivitamin with minerals  1 tablet Oral Daily  . QUEtiapine  75 mg Oral BID  . rivaroxaban  10 mg Oral Daily  . senna-docusate  3 tablet Oral BID  . simvastatin  20 mg Oral QHS  . vitamin C  500 mg Per Tube Daily  . zinc sulfate  220 mg Per Tube Daily   Continuous Infusions: PRN Meds:.acetaminophen, clonazePAM, haloperidol lactate, metoprolol tartrate, ondansetron (ZOFRAN) IV   Time Spent in minutes  25  See all Orders from today for further details   Oren Binet M.D on 10/05/2019 at 11:19 AM  To page go to [email or web address] - use universal password  [facility] -  Office  [phone]    Objective:   Vitals:   10/05/19 0400 10/05/19 0413 10/05/19 0739 10/05/19 0858  BP:  (!) 99/49 121/62 121/62  Pulse:  95 94 94  Resp: 20 19 18    Temp:  98.9 F (37.2 C) 97.9 F (36.6 C)   TempSrc:  Oral Oral   SpO2: (!) 86% 91% 95%   Weight:  61.3 kg    Height:        Wt Readings from Last 3 Encounters:  10/05/19  61.3 kg     Intake/Output Summary (Last 24 hours) at 10/05/2019 1119 Last data filed at 10/05/2019 0900 Gross per 24 hour  Intake 1435 ml  Output --  Net 1435 ml     Physical Exam Gen Exam more alert-not in any distress HEENT:atraumatic, normocephalic Chest: B/L clear to auscultation anteriorly CVS:S1S2 regular Abdomen:soft non tender, non distended Extremities:no edema Neurology: Non focal Skin: no rash   Data Review:    CBC Recent Labs  Lab 09/29/19 0411 09/30/19 0619 10/01/19 0415 10/02/19 1040 10/04/19 0046  WBC 7.7 7.8 7.2 9.9 5.3  HGB 10.6* 11.1* 10.7* 11.8* 10.2*  HCT 33.3* 34.6* 34.2* 38.3 32.6*  PLT 227 278 256 291 253  MCV 94.9 94.5 96.6 97.0 95.6  MCH 30.2 30.3 30.2 29.9 29.9  MCHC 31.8 32.1 31.3 30.8 31.3  RDW 14.5 14.8 14.9 15.0 15.4    Chemistries  Recent Labs  Lab 09/29/19 0411 09/30/19 0619 10/01/19 0415 10/02/19 0515 10/04/19 0046 10/05/19 0000  NA 138 136 137 136 138 137  K 3.2* 3.5 4.5 4.1 3.4* 4.1  CL 98 99 102 106 105 103  CO2 23 25 24  20* 23 25  GLUCOSE 105* 107* 80 87 93 106*  BUN 41* 42* 36* 33* 27* 24*  CREATININE 1.38* 1.38* 1.26* 1.14* 1.14* 1.05*  CALCIUM 8.6* 8.7* 8.7* 8.4* 8.8* 9.4  MG  --   --   --   --   --  1.8  AST 27 26 26 24   --   --  ALT 26 25 25 20   --   --   ALKPHOS 55 57 56 52  --   --   BILITOT 0.8 0.9 0.7 0.7  --   --    ------------------------------------------------------------------------------------------------------------------ No results for input(s): CHOL, HDL, LDLCALC, TRIG, CHOLHDL, LDLDIRECT in the last 72 hours.  Lab Results  Component Value Date   HGBA1C 6.1 (H) 09/20/2019   ------------------------------------------------------------------------------------------------------------------ No results for input(s): TSH, T4TOTAL, T3FREE, THYROIDAB in the last 72 hours.  Invalid input(s):  FREET3 ------------------------------------------------------------------------------------------------------------------ No results for input(s): VITAMINB12, FOLATE, FERRITIN, TIBC, IRON, RETICCTPCT in the last 72 hours.  Coagulation profile No results for input(s): INR, PROTIME in the last 168 hours.  No results for input(s): DDIMER in the last 72 hours.  Cardiac Enzymes No results for input(s): CKMB, TROPONINI, MYOGLOBIN in the last 168 hours.  Invalid input(s): CK ------------------------------------------------------------------------------------------------------------------    Component Value Date/Time   BNP 29.1 09/20/2019 1415    Micro Results Recent Results (from the past 240 hour(s))  Novel Coronavirus, NAA (Hosp order, Send-out to Ref Lab; TAT 18-24 hrs     Status: Abnormal   Collection Time: 09/30/19  2:38 PM   Specimen: Nasopharyngeal Swab; Respiratory  Result Value Ref Range Status   SARS-CoV-2, NAA DETECTED (A) NOT DETECTED Final    Comment: (NOTE)                  Client Requested Flag This nucleic acid amplification test was developed and its performance characteristics determined by [employer]. Nucleic acid amplification tests include PCR and TMA. This test has not been FDA cleared or approved. This test has been authorized by FDA under an Emergency Use Authorization (EUA). This test is only authorized for the duration of time the declaration that circumstances exist justifying the authorization of the emergency use of in vitro diagnostic tests for detection of SARS-CoV-2 virus and/or diagnosis of COVID-19 infection under section 564(b)(1) of the Act, 21 U.S.C. [id number]) (1), unless the authorization is terminated or revoked sooner. When diagnostic testing is negative, the possibility of a false negative result should be considered in the context of a patient's recent exposures and the presence of clinical signs and symptoms consistent with  COVID-19. An individual without symptoms of COVID-  19 and who is not shedding SARS-CoV-2 virus would expect to have a negative (not detected) result in this assay. Performed At: [facility] [place], [place] [id number] Rush Farmer MD [id number]    Marianna  Final    Comment: Performed at [facility] [address], [place], [place] [address]    Radiology Reports CT HEAD WO CONTRAST  Result Date: 09/23/2019 CLINICAL DATA:  Acute speech disturbance.  Coronavirus infection. EXAM: CT HEAD WITHOUT CONTRAST TECHNIQUE: Contiguous axial images were obtained from the base of the skull through the vertex without intravenous contrast. COMPARISON:  None. FINDINGS: Brain: Age related atrophy. Chronic small-vessel ischemic change of the pons. No focal cerebellar insult. 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 No sign of acute infarction, mass lesion, hemorrhage, hydrocephalus or extra-axial collection. Vascular: There is atherosclerotic calcification of the major vessels at the base of the brain. Skull: Negative Sinuses/Orbits: Clear/normal Other: None IMPRESSION: No acute finding by CT. 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 No sign of acute insult at this time. Electronically Signed   By: Elta Guadeloupe  Shogry M.D.   On: 09/23/2019 09:43   MR ANGIO HEAD WO CONTRAST  Result Date: 09/26/2019 CLINICAL DATA:  Expressive aphasia and facial droop following extubation. EXAM: MRI HEAD WITHOUT CONTRAST MRA HEAD WITHOUT CONTRAST TECHNIQUE: Multiplanar, multiecho pulse sequences of the brain and surrounding structures were obtained without intravenous contrast. Angiographic images of the head were obtained using MRA technique without contrast. COMPARISON:  None. FINDINGS: The  examination had to be discontinued prior to completion due to patient mental status and inability to cooperate with the technologist's instructions. Additionally, the examination is degraded by motion. Nine series are provided for interpretation. MRI HEAD FINDINGS BRAIN: There is no acute infarct, acute hemorrhage or extra-axial collection. Diffuse confluent hyperintense T2-weighted signal within the periventricular, deep and juxtacortical white matter, most commonly due to chronic ischemic microangiopathy. There is generalized atrophy without lobar predilection. The midline structures are normal. VASCULAR: The major intracranial arterial and venous sinus flow voids are normal. Susceptibility-sensitive sequences show no chronic microhemorrhage or superficial siderosis. SKULL AND UPPER CERVICAL SPINE: Calvarial bone marrow signal is normal. There is no skull base mass. The visualized upper cervical spine and soft tissues are normal. SINUSES/ORBITS: There are no fluid levels or advanced mucosal thickening. The mastoid air cells and middle ear cavities are free of fluid. The orbits are normal. MRA HEAD FINDINGS The MRA portion of the examination is severely motion degraded. There is no antegrade flow demonstrated in the left ICA. The bilateral anterior and middle cerebral arteries are normal. The basilar artery and the posterior cerebral arteries are patent. IMPRESSION: 1. Motion degraded examination with no acute ischemia. 2. 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 3. Severely motion degraded MRA without antegrade flow in the left ICA. By history, the patient has a known left ICA occlusion. 4. MRA of the neck was not performed due to difficulties described above. CTA of the neck may provide better characterization with a shorter imaging time. Electronically Signed   By: Ulyses Jarred M.D.   On: 09/26/2019 00:02   MR BRAIN WO CONTRAST  Result Date: 09/26/2019 CLINICAL DATA:  Expressive aphasia and facial  droop following extubation. EXAM: MRI HEAD WITHOUT CONTRAST MRA HEAD WITHOUT CONTRAST TECHNIQUE: Multiplanar, multiecho pulse sequences of the brain and surrounding structures were obtained without intravenous contrast. Angiographic images of the head were obtained using MRA technique without contrast. COMPARISON:  None. FINDINGS: The examination had to be discontinued prior to completion due to patient mental status and inability to cooperate with the technologist's instructions. Additionally, the examination is degraded by motion. Nine series are provided for interpretation. MRI HEAD FINDINGS BRAIN: There is no acute infarct, acute hemorrhage or extra-axial collection. Diffuse confluent hyperintense T2-weighted signal within the periventricular, deep and juxtacortical white matter, most commonly due to chronic ischemic microangiopathy. There is generalized atrophy without lobar predilection. The midline structures are normal. VASCULAR: The major intracranial arterial and venous sinus flow voids are normal. Susceptibility-sensitive sequences show no chronic microhemorrhage or superficial siderosis. SKULL AND UPPER CERVICAL SPINE: Calvarial bone marrow signal is normal. There is no skull base mass. The visualized upper cervical spine and soft tissues are normal. SINUSES/ORBITS: There are no fluid levels or advanced mucosal thickening. The mastoid air cells and middle ear cavities are free of fluid. The orbits are normal. MRA HEAD FINDINGS The MRA portion of the examination is severely motion degraded. There is no antegrade flow demonstrated in the left ICA. The bilateral anterior and middle cerebral arteries are normal. The basilar artery and the  posterior cerebral arteries are patent. IMPRESSION: 1. Motion degraded examination with no acute ischemia. 2. 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 3. Severely motion degraded MRA without antegrade flow in the left ICA. By history, the patient has a known left  ICA occlusion. 4. MRA of the neck was not performed due to difficulties described above. CTA of the neck may provide better characterization with a shorter imaging time. Electronically Signed   By: Ulyses Jarred M.D.   On: 09/26/2019 00:02   DG CHEST PORT 1 VIEW  Result Date: 10/03/2019 CLINICAL DATA:  Acute respiratory disease, COVID positive EXAM: PORTABLE CHEST 1 VIEW COMPARISON:  09/28/2019 FINDINGS: Right PICC line tip SVC RA junction level. Stable heart size and vascularity. Negative for edema or effusion. No pneumothorax. Given changes in technique, stable patchy mixed airspace and interstitial opacities throughout both lungs, slightly worse on the right. Trachea midline. Aorta atherosclerotic. Remote left mastectomy and axillary lymph node dissection. Degenerative changes of the spine with a mild scoliosis. IMPRESSION: Stable patchy bilateral airspace and interstitial opacities, worse on the right. No significant interval change. Electronically Signed   By: Jerilynn Mages.  Shick M.D.   On: 10/03/2019 08:12   DG CHEST PORT 1 VIEW  Result Date: 09/28/2019 CLINICAL DATA:  CHF. EXAM: PORTABLE CHEST 1 VIEW COMPARISON:  09/23/2019.  09/20/2019. FINDINGS: Right PICC line noted with tip over superior vena cava. Stable cardiomegaly. No pulmonary venous congestion. Diffuse bilateral pulmonary infiltrates. Slight worsening from prior exam. No pleural effusion or pneumothorax. Prior left mastectomy. Surgical clips over upper abdomen. Degenerative changes thoracic spine. IMPRESSION: 1.  Right PICC line noted with tip over superior vena cava. 2. Diffuse bilateral pulmonary interstitial infiltrates. Slight worsening from prior exam. Electronically Signed   By: Marcello Moores  Register   On: 09/28/2019 08:15   DG Chest Port 1 View  Result Date: 09/23/2019 CLINICAL DATA:  Hypoxia EXAM: PORTABLE CHEST - 1 VIEW COMPARISON:  09/20/2019 FINDINGS: Patient has been extubated and the nasogastric tube removed. Right arm PICC stable.  Persistent interstitial opacities throughout both mid lung fields. Slight worsening of patchy airspace opacities in both lung bases. Heart size stable.  Aortic Atherosclerosis (ICD10-170.0). No definite effusion. No pneumothorax. Visualized bones unremarkable. IMPRESSION: 1. Slight worsening of patchy airspace opacities in both lung bases. 2. Stable interstitial opacities throughout both lungs. 3. Stable support apparatus. 4. Aortic Atherosclerosis (ICD10-170.0). Electronically Signed   By: Lucrezia Europe M.D.   On: 09/23/2019 14:25   Portable chest 1 View  Result Date: 09/20/2019 CLINICAL DATA:  Shortness of breath EXAM: PORTABLE CHEST 1 VIEW COMPARISON:  None. FINDINGS: Patchy areas of interstitial airspace disease throughout the right lung and to lesser extent left mid lung. No pleural effusion or pneumothorax. Stable cardiomediastinal silhouette. No aggressive osseous lesion. Endotracheal tube with the tip 6 cm above the carina. Right-sided PICC line with the tip projecting over the SVC. Nasogastric tube coursing below the diaphragm. No acute osseous abnormality. IMPRESSION: 1. Patchy areas of interstitial airspace disease throughout the right lung and to lesser extent left mid lung concerning for atypical viral pneumonia. Electronically Signed   By: Kathreen Devoid   On: 09/20/2019 13:45   ECHOCARDIOGRAM COMPLETE  Result Date: 09/21/2019   ECHOCARDIOGRAM REPORT   Patient Name:   TENEISHA Mateja Date of Exam: 09/20/2019 Medical Rec #:  [id number]       Height:       67.0 in Accession #:    [id number]      Weight:  163.1 lb Date of Birth:  Dec 08, 1940       BSA:          1.85 m Patient Age:    28 years        BP:           74/50 mmHg Patient Gender: F               HR:           83 bpm. Exam Location:  Inpatient Procedure: 2D Echo Indications:    Dyspnea 786.09 / R06.00  History:        Patient has no prior history of Echocardiogram examinations.                 Risk Factors:Hypertension and Former Smoker.  COVID                 pneumoniaChronic kidney disease, carotid stenosis, past history                 of breast cancer, mastectomy and chemotherapy, unprovoked DVT.  Sonographer:    Darlina Sicilian RDCS Referring Phys: [place]  1. Technically difficult study. Left ventricular ejection fraction, by visual estimation, is 35 to 40%. The left ventricle has moderately decreased function. There is mildly increased left ventricular hypertrophy. Inferior/septal akinesis  2. Left ventricular diastolic parameters are consistent with Grade I diastolic dysfunction (impaired relaxation).  3. Global right ventricle has normal systolic function. The right ventricular size is normal.  4. Left atrial size was normal.  5. Right atrial size was not well visualized.  6. The mitral valve is normal in structure. Trace mitral valve regurgitation.  7. The tricuspid valve is not well visualized. Tricuspid valve regurgitation is trivial.  8. The aortic valve was not well visualized. Aortic valve regurgitation is mild. No evidence of aortic valve stenosis.  9. The pulmonic valve was not well visualized. Pulmonic valve regurgitation is not visualized. FINDINGS  Left Ventricle: Left ventricular ejection fraction, by visual estimation, is 35 to 40%. The left ventricle has moderately decreased function. There is mildly increased left ventricular hypertrophy. Left ventricular diastolic parameters are consistent with Grade I diastolic dysfunction (impaired relaxation). Right Ventricle: The right ventricular size is not well visualized. No increase in right ventricular wall thickness. Global RV systolic function is has normal systolic function. Left Atrium: Left atrial size was normal in size. Right Atrium: Right atrial size was not well visualized Pericardium: There is no evidence of pericardial effusion. Presence of pericardial fat pad. Mitral Valve: The mitral valve is normal in structure. Trace mitral valve  regurgitation. Tricuspid Valve: The tricuspid valve is not well visualized. Tricuspid valve regurgitation is trivial. Aortic Valve: The aortic valve was not well visualized. Aortic valve regurgitation is mild. The aortic valve is structurally normal, with no evidence of sclerosis or stenosis. Pulmonic Valve: The pulmonic valve was not well visualized. Pulmonic valve regurgitation is not visualized. Aorta: The aortic root is normal in size and structure. IAS/Shunts: The interatrial septum was not well visualized.  LEFT VENTRICLE PLAX 2D LVOT diam:     2.00 cm       Diastology LVOT Area:     3.14 cm      LV e' lateral:   7.18 cm/s                              LV E/e' lateral: 6.7  LV e' medial:    4.03 cm/s LV Volumes (MOD)             LV E/e' medial:  11.9 LV area d, A2C:    27.90 cm LV area d, A4C:    30.40 cm LV area s, A2C:    21.60 cm LV area s, A4C:    20.90 cm LV major d, A2C:   8.53 cm LV major d, A4C:   8.04 cm LV major s, A2C:   7.81 cm LV major s, A4C:   7.36 cm LV vol d, MOD A2C: 74.2 ml LV vol d, MOD A4C: 94.4 ml LV vol s, MOD A2C: 51.0 ml LV vol s, MOD A4C: 49.2 ml LV SV MOD A2C:     23.2 ml LV SV MOD A4C:     94.4 ml LV SV MOD BP:      34.7 ml LEFT ATRIUM           Index LA Vol (A2C): 23.2 ml 12.51 ml/m LA Vol (A4C): 20.9 ml 11.27 ml/m  AORTIC VALVE LVOT Vmax:   76.90 cm/s LVOT Vmean:  36.500 cm/s LVOT VTI:    0.076 m MITRAL VALVE MV Area (PHT): 3.08 cm             SHUNTS MV PHT:        71.34 msec           Systemic VTI:  0.08 m MV Decel Time: 246 msec             Systemic Diam: 2.00 cm MV E velocity: 48.00 cm/s 103 cm/s MV A velocity: 96.00 cm/s 70.3 cm/s MV E/A ratio:  0.50       1.5  Oswaldo Milian MD Electronically signed by Oswaldo Milian MD Signature Date/Time: 09/21/2019/12:17:47 AM    Final

## 2019-10-05 NOTE — Progress Notes (Signed)
1700- Patient up in chair today. Remains compliant with treatment plan. No aggressive behavior. No PRN needed. Safety measures in place. Call bell and items within reach. Update given to family. Spoke with patient's sister Izora Gala.

## 2019-10-05 NOTE — Plan of Care (Signed)
Patient started shift complaining of anxiety, relieved with prn medication, o2 requirements remain low @ 1L, gait Is very shuffled and unsteady, dressing changed on picc line, fsbs @ vitals wnl, Mews score remained green, safety precautions maintain, pending IP Rehab, continue poc Problem: Education: Goal: Knowledge of General Education information will improve Description: Including pain rating scale, medication(s)/side effects and non-pharmacologic comfort measures Outcome: Progressing   Problem: Health Behavior/Discharge Planning: Goal: Ability to manage health-related needs will improve Outcome: Progressing   Problem: Clinical Measurements: Goal: Ability to maintain clinical measurements within normal limits will improve Outcome: Progressing Goal: Will remain free from infection Outcome: Progressing Goal: Diagnostic test results will improve Outcome: Progressing Goal: Respiratory complications will improve Outcome: Progressing Goal: Cardiovascular complication will be avoided Outcome: Progressing   Problem: Activity: Goal: Risk for activity intolerance will decrease Outcome: Progressing   Problem: Nutrition: Goal: Adequate nutrition will be maintained Outcome: Progressing   Problem: Coping: Goal: Level of anxiety will decrease Outcome: Progressing   Problem: Elimination: Goal: Will not experience complications related to bowel motility Outcome: Progressing Goal: Will not experience complications related to urinary retention Outcome: Progressing   Problem: Pain Managment: Goal: General experience of comfort will improve Outcome: Progressing   Problem: Safety: Goal: Ability to remain free from injury will improve Outcome: Progressing   Problem: Skin Integrity: Goal: Risk for impaired skin integrity will decrease Outcome: Progressing   Problem: Education: Goal: Knowledge of risk factors and measures for prevention of condition will improve Outcome: Progressing   Problem: Coping: Goal: Psychosocial and spiritual needs will be supported Outcome: Progressing   Problem: Respiratory: Goal: Will maintain a patent airway Outcome: Progressing Goal: Complications related to the disease process, condition or treatment will be avoided or minimized Outcome: Progressing

## 2019-10-05 NOTE — TOC Progression Note (Signed)
Transition of Care [facility]) - Progression Note    Patient Details  Name: Christie Harper MRN: [id number] Date of Birth: 26-Jul-1941  [facility]) CM/SW Contact  Shade Flood, LCSW Phone Number: 10/05/2019, 2:23 PM  Clinical Narrative:     TOC following. Insurance denied CIR. Re-submitted for SNF authorization today. Do not have any bed offers yet. Called the SNF admissions coordinators to follow up. There appears to be some concern regarding behavior and need for tele sitter as documented on 12/16. Still awaiting decisions.  Updated MD and RN. Will update pt's sister once more information is available.   TOC will follow.  Expected Discharge Plan: Skilled Nursing Facility Barriers to Discharge: Insurance Authorization  Expected Discharge Plan and Services Expected Discharge Plan: [place] In-house Referral: Clinical Social Work   Post Acute Care Choice: [place] Living arrangements for the past 2 months: Single Family Home                                       Social Determinants of Health (SDOH) Interventions    Readmission Risk Interventions No flowsheet data found.

## 2019-10-05 NOTE — Progress Notes (Signed)
Physical Therapy Treatment Patient Details Name: Christie Harper MRN: [id number] DOB: 1940/11/09 Today's Date: 10/05/2019    History of Present Illness Pt is an 78 y.o. female initially admitted to [facility] on 09/09/19 with mild hypoxemia and diarrhea, (+) COVID-19 test 11/22, treated with Decadron for 10 days, remdesivir 5 days; hypoxemia progressed requiring ETT 11/30-12/4. Head CT negative for acute abnormality; MRI 12/7 with no acute ischemia and severe chronic small vessel disease; pt with continued AMS. 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   PT Comments    Pt pleasant and agreeable to participate; no yelling out noted this session. Remains impulsive with movement with poor attention and decreased safety awareness. Able to work on transfer, gait and balance training. Pt requires modA to prevent LOB with standing mobility. Demonstrates very poor balance strategies/postural reactions, losing balance with minimal perturbation/external challenge to balance. Remains an excellent candidate for intensive CIR-level therapies.   Follow Up Recommendations  CIR;Supervision/Assistance - 24 hour     Equipment Recommendations  (TBD)    Recommendations for Other Services Rehab consult     Precautions / Restrictions Precautions Precautions: Fall Restrictions Weight Bearing Restrictions: No    Mobility  Bed Mobility Overal bed mobility: Needs Assistance Bed Mobility: Supine to Sit     Supine to sit: Min guard        Transfers Overall transfer level: Needs assistance Equipment used: 1 person hand held assist Transfers: Sit to/from Stand Sit to Stand: Mod assist         General transfer comment: Impulsive, frequent cues for safety. Reliant on HHA and modA to elevate  trunk; poor eccentric control into sitting  Ambulation/Gait Ambulation/Gait assistance: Min assist;Mod assist Gait Distance (Feet): 15 Feet Assistive device: 1 person hand held assist;2 person hand held assist   Gait velocity: Decreased   General Gait Details: Reliant on HHA and modA to maintain balance with gait; pt also reaching to furniture for additional UE support. Pt attempting to sit prematurely despite cues; pleasant, but at times difficult to redirect. SpO2 down to 80s on 3-4L O2, reading >/90% when switched from finger to ear probe   Stairs             Wheelchair Mobility    Modified Rankin (Stroke Patients Only)       Balance Overall balance assessment: Needs assistance Sitting-balance support: Feet supported;No upper extremity supported Sitting balance-Leahy Scale: Fair     Standing balance support: During functional activity;Bilateral upper extremity supported;Single extremity supported Standing balance-Leahy Scale: Poor Standing balance comment: Reliant on UE support and external assist; unable to tolerate challenge to balance                            Cognition Arousal/Alertness: Awake/alert Behavior During Therapy: Impulsive Overall Cognitive Status: Impaired/Different from baseline Area of Impairment: Attention;Memory;Following commands;Safety/judgement;Awareness;Problem solving                 Orientation Level: Disoriented to;Time Current Attention Level: Sustained Memory: Decreased short-term memory;Decreased recall of precautions Following Commands: Follows one step commands inconsistently Safety/Judgement: Decreased awareness of safety;Decreased awareness of deficits Awareness: Emergent Problem Solving: Slow processing;Difficulty sequencing;Requires tactile cues;Requires verbal cues General Comments: Repeated cues for location orientation question, pt able to state "hospital" and eventually "[place]". December 2020, "the  second  tuesday of the month." Continues to demonstrate poor attention, requiring frequent redirection to task. Difficult to redirect with one-step commands at times as pt impulsive, seems exacerbated with fatigue. No yelling out this session. Very poor safety awareness      Exercises      General Comments        Pertinent Vitals/Pain Pain Assessment: Faces Faces Pain Scale: Hurts a little bit Pain Location: Generalized Pain Descriptors / Indicators: Discomfort Pain Intervention(s): Monitored during session    Home Living                      Prior Function            PT Goals (current goals can now be found in the care plan section) Progress towards PT goals: Progressing toward goals    Frequency    Min 3X/week      PT Plan Current plan remains appropriate    Co-evaluation              AM-PAC PT "6 Clicks" Mobility   Outcome Measure  Help needed turning from your back to your side while in a flat bed without using bedrails?: None Help needed moving from lying on your back to sitting on the side of a flat bed without using bedrails?: None Help needed moving to and from a bed to a chair (including a wheelchair)?: A Lot Help needed standing up from a chair using your arms (e.g., wheelchair or bedside chair)?: A Lot Help needed to walk in hospital room?: A Lot Help needed climbing 3-5 steps with a railing? : A Lot 6 Click Score: 16    End of Session Equipment Utilized During Treatment: Oxygen Activity Tolerance: Patient tolerated treatment well Patient left: in chair;with call bell/phone within reach;with chair alarm set(adamantly declining posey alarm belt; used chair pad underneath instead) Nurse Communication: Mobility status PT Visit Diagnosis: Unsteadiness on feet (R26.81);Muscle weakness (generalized) (M62.81);Difficulty in walking, not elsewhere classified (R26.2);Other symptoms and signs involving the nervous system (N17.001)     Time:  7494-4967 PT Time Calculation (min) (ACUTE ONLY): 20 min  Charges:  $Neuromuscular Re-education: 8-22 mins                     Mabeline Caras, PT, DPT Acute Rehabilitation Services  Pager [phone] Office [place] 10/05/2019, 9:51 AM

## 2019-10-05 NOTE — Care Management Important Message (Signed)
Important Message  Patient Details  Name: Christie Harper MRN: [id number] Date of Birth: 03/14/1941   Medicare Important Message Given:  Yes - Important Message mailed due to current National Emergency  Verbal consent obtained due to current National Emergency  Relationship to patient: Brother/Sister Contact Name: Dorann Lodge Call Date: 10/05/19  Time: 1634 Phone: [phone] Outcome: No Answer/Busy Important Message mailed to: Patient address on file    Delorse Lek 10/05/2019, 4:35 PM

## 2019-10-06 ENCOUNTER — Other Ambulatory Visit: Payer: Self-pay

## 2019-10-06 LAB — GLUCOSE, CAPILLARY
Glucose-Capillary: 131 mg/dL — ABNORMAL HIGH (ref 70–99)
Glucose-Capillary: 108 mg/dL — ABNORMAL HIGH (ref 70–99)
Glucose-Capillary: 86 mg/dL (ref 70–99)
Glucose-Capillary: 99 mg/dL (ref 70–99)

## 2019-10-06 MED ORDER — PROMETHAZINE HCL 25 MG/ML IJ SOLN
12.5000 mg | Freq: Four times a day (QID) | INTRAMUSCULAR | Status: DC | PRN
  Administered 2019-10-06: 16:00:00 12.5 mg via INTRAVENOUS
  Filled 2019-10-06: qty 1

## 2019-10-06 MED ORDER — FUROSEMIDE 20 MG PO TABS
20.0000 mg | ORAL_TABLET | Freq: Every day | ORAL | Status: DC
  Administered 2019-10-06: 20 mg via ORAL
  Filled 2019-10-06: qty 1

## 2019-10-06 MED ORDER — PANTOPRAZOLE SODIUM 40 MG PO TBEC
40.0000 mg | DELAYED_RELEASE_TABLET | Freq: Every day | ORAL | Status: DC
  Administered 2019-10-06 – 2019-10-07 (×2): 40 mg via ORAL
  Filled 2019-10-06 (×2): qty 1

## 2019-10-06 NOTE — Plan of Care (Signed)

## 2019-10-06 NOTE — Progress Notes (Signed)
Phoned the Pt's sister today, advised her that we were keeping the Pt here one more day vs doing to SNF today, due to increase HR today - just to watch it over night.  Left message on answer machine - Sister Identified herself on VM.

## 2019-10-06 NOTE — TOC Progression Note (Addendum)
Transition of Care [facility]) - Progression Note    Patient Details  Name: Christie Harper MRN: [id number] Date of Birth: July 27, 1941  Transition of Care [facility]) CM/SW Contact  Rae Mar, RN Phone Number: 10/06/2019, 3:46 PM  Clinical Narrative:     CM contacted multiple facilities in an effort to assist progression of this pt to the next transition step in care.  [place] - Left VM and text with no response yet [place] - No beds still [facility] - May have beds Mon or Tues and appears to be able to accept at that time now [place] - 10 patient waiting list for covid facility [place] - No beds, might have one at the earliest Tues? [place] with pt's sister Izora Gala and friend Romie Minus to update.  They are aware of our continued SNF facility search.  Their preference is as close to [facility] as possible but know they cannot visit the SNF facilities at this time.  Romie Minus plans to stay with the pt when she returns home after SNF rehab to assist as long as it is safe for her to do so and pt is covid symptoms free.  CM updated Dena at [facility]. Current auth good through end of business day 12/24.  CM updated Dr. Sloan Leiter.  TOC team will continue efforts.  Update: [facility] is able to accept pt today. CM spoke with pt's sister who agreed to the facility.  CM updated Dena at [facility].  CM updated Vinnie Level, RN and Dr. Sloan Leiter who advised pt just had a change in status and will plan on D/C tomorrow.  Amber at [facility] said that is ok.  Pt will need to transport via [facility].  Please call report to [phone].  Pt will be going to the 2nd floor.  TOC of care team will continue to follow.  Expected Discharge Plan: Skilled Nursing Facility Barriers to Discharge: SNF Pending bed offer, Requiring sitter/restraints  Expected Discharge Plan and Services Expected Discharge Plan: [place] In-house  Referral: Clinical Social Work   Post Acute Care Choice: [place] Living arrangements for the past 2 months: Single Family Home                                       Social Determinants of Health (SDOH) Interventions    Readmission Risk Interventions No flowsheet data found.

## 2019-10-06 NOTE — TOC Progression Note (Signed)
Transition of Care [facility]) - Progression Note    Patient Details  Name: Christie Harper MRN: [id number] Date of Birth: Mar 14, 1941  Transition of Care [facility]) CM/SW Contact  Shade Flood, LCSW Phone Number: 10/06/2019, 10:45 AM  Clinical Narrative:     Received call from [place] at [facility] stating that they did not receive the clinical that was sent through [place] fax yesterday. Printed and faxed clinical this AM. Coralyn Helling states that they need to be notified as soon as SNF is determined. Updated covering [place] RNCM for follow up.  Expected Discharge Plan: Skilled Nursing Facility Barriers to Discharge: SNF Pending bed offer, Requiring sitter/restraints  Expected Discharge Plan and Services Expected Discharge Plan: [place] In-house Referral: Clinical Social Work   Post Acute Care Choice: [place] Living arrangements for the past 2 months: Single Family Home                                       Social Determinants of Health (SDOH) Interventions    Readmission Risk Interventions No flowsheet data found.

## 2019-10-06 NOTE — Progress Notes (Addendum)
PROGRESS NOTE                                                                                                                                                                                                             Patient Demographics:    Christie Harper, is a 78 y.o. female, DOB - Jul 25, 1941, [id number]  Outpatient Primary MD for the patient is System, Pcp Not In   Admit date - 09/20/2019   LOS - 16  No chief complaint on file.      Brief Narrative: 78 year old female with past medical history of HTN, breast cancer-s/p mastectomy/chemotherapy, CKD stage IIIa, VTE on Xarelto-who presented to [facility] on 11/21 with diarrhea and mild hypoxemia.  She was started on steroids and remdesivir, but continued to develop progressive hypoxemia-and was intubated.  She was subsequently transferred to [facility] managed in the ICU per ARDS-net protocol.  Patient was liberated from the ventilator on 12/4.  She has had a prolonged hospitalization-complicated by bacterial pneumonia, acute focal neurological deficit with slurred speech  (negative MRI for acute CVA ), and acute on chronic systolic heart failure.  See below for further details.  Significant events: 11/21 admit [facility] diarrhea, mild hypoxemia 11/22 COVID test positive, started remdesivir and decadron 11/24 4-8 L O2 11/30 started heated high flow O2, new fever, intubated 12/2 transfer to [facility] 12/4 extubated 12/6 facial droop/slurred speech 12/7 MRI brain: Negative for infarct.    Subjective:    [name] today is awake and alert.  No major events overnight.   Assessment  & Plan :   Acute hypoxic respiratory failure due to SARS COVID-19 viral pneumonia/secondary bacterial infection: Significantly improved-she is on just 1-2 L of oxygen.  She has finished a course of antibiotics, remdesivir/steroids.  Continue supportive  care.  Fever: afebrile  O2 requirements:  SpO2: 94 % O2 Flow Rate (L/min): 2 L/min FiO2 (%): 40 %   COVID-19 Labs: No results for input(s): DDIMER, FERRITIN, LDH, CRP in the last 72 hours.     Component Value Date/Time   BNP 29.1 09/20/2019 1415    No results for input(s): PROCALCITON in the last 168 hours.  Lab Results  Component Value Date   SARSCOV2NAA DETECTED (A) 09/30/2019    11/19 SARS COV 2 > positive (performed at her PCP office)  COVID-19  Medications: Decadron 11/22> 12/1 (at outside hospital) Remdesivir 11/22> 11/26 (at outside hospital)  Other medications: Diuretics:Euvolemic-no need for lasix Antibiotics: Ceftriaxone 11/24 > 11/28 (at outside hospital) Azithro 11/24 > 11/28 (at outside hospital) Cefepime: 12/3>> 12/7  Prone/Incentive Spirometry: encouraged incentive spirometry use 3-4/hour  DVT Prophylaxis  :  Lovenox   Acute metabolic encephalopathy: Improved-confusion slowly continues to abate.  This morning she is very alert and awake. Suspect etiology to be lingering ICU delirium-probably had some mild cognitive dysfunction at baseline.  Continue supportive care with Seroquel.  Due to anxiety issues-she was started on Klonopin with good response.  MRI brain without any acute abnormalities.  Suspected TIA: Developed transient slurred speech on 12/6-MRI brain did not show any acute findings.  Continue aspirin/statin   Essential hypertension: Controlled-continue metoprolol Continue to monitor blood pressures.  Noted to be on metoprolol.  Chronic systolic CHF (EF [id number] by TTE on 12/2): Euvolemic on exam-will resume Lasix. Continue beta-blocker.  Reviewed records in care everywhere-she follows with cardiology at [facility] appears she has a prior history of diastolic heart failure.  Patient will need further work-up to be done in the outpatient setting.  Dyslipidemia: Continue statin  History of unprovoked VTE: Continue Xarelto  History of  coronary artery disease: No anginal symptoms: Continue aspirin/statin and beta-blocker.  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  History of PAD-s/p bilateral iliac and femoral stenting: Stable-continue aspirin/statin.  History of breast cancer-s/p modified radical mastectomy 2005-continue outpatient follow-up with oncology at [facility].  Diabetes mellitus type 2 (A1c 6.1): CBG stable-monitor closely.  Due to episodes of hypoglycemia-SSI has been discontinued.   CKD stage 3b: Creatinine close to baseline-follow.   Normocytic anemia: Hemoglobin stable-follow periodically.  Debility/deconditioning: Secondary to acute illness-rehab services recommending CIR-but denied insurance authorization-plans are for SNF.   Nutrition Problem: Nutrition Problem: Increased nutrient needs Etiology: acute illness(COVID-19) Signs/Symptoms: estimated needs Interventions: Ensure Enlive (each supplement provides 350kcal and 20 grams of protein), Magic cup, Hormel Shake  RN pressure injury documentation: Pressure Injury 09/22/19 Other (Comment) Right;Distal;Lateral dark purple spot r/t ETT (Active)  09/22/19 1400  Location: Other (Comment) (tongue)  Location Orientation: Right;Distal;Lateral  Staging:   Wound Description (Comments): dark purple spot r/t ETT  Present on Admission: No    Consults  :  PCCM  Procedures  :   11/30 ETT >  11/30 R arm PICC >   ABG:    Component Value Date/Time   PHART 7.471 (H) 09/22/2019 1032   PCO2ART 46.1 09/22/2019 1032   PO2ART 63.0 (L) 09/22/2019 1032   HCO3 33.4 (H) 09/22/2019 1032   TCO2 35 (H) 09/22/2019 1032   O2SAT 92.0 09/22/2019 1032    Vent Settings: N/A  Condition - Stable  Family Communication  :  Sister updated over the phone on 12/16-left a voicemail on 12/18  Code Status :  Partial Diet :  Diet Order            DIET DYS 3 Room service appropriate?  Yes; Fluid consistency: Thin  Diet effective now               Disposition Plan  :  Remain hospitalized-plans for CIR on discharge.  Barriers to discharge: Hypoxia requiring O2 supplementation  Antimicorbials  :    Anti-infectives (From admission, onward)   Start     Dose/Rate Route Frequency Ordered Stop   09/21/19 1100  ceFEPIme (MAXIPIME) 2 g in  sodium chloride 0.9 % 100 mL IVPB     2 g 200 mL/hr over 30 Minutes Intravenous Every 12 hours 09/21/19 1022 09/26/19 0118      Inpatient Medications  Scheduled Meds: . alteplase  2 mg Intracatheter Once  . aspirin EC  81 mg Oral Daily  . Chlorhexidine Gluconate Cloth  6 each Topical Daily  . cilostazol  50 mg Oral BID  . DULoxetine  60 mg Oral BID  . feeding supplement (ENSURE ENLIVE)  237 mL Oral TID BM  . ferrous sulfate  325 mg Oral BID  . mouth rinse  15 mL Mouth Rinse BID  . metoprolol tartrate  25 mg Oral BID  . multivitamin with minerals  1 tablet Oral Daily  . QUEtiapine  75 mg Oral BID  . rivaroxaban  10 mg Oral Daily  . senna-docusate  3 tablet Oral BID  . simvastatin  20 mg Oral QHS  . vitamin C  500 mg Per Tube Daily  . zinc sulfate  220 mg Per Tube Daily   Continuous Infusions: PRN Meds:.acetaminophen, clonazePAM, haloperidol lactate, metoprolol tartrate, ondansetron (ZOFRAN) IV   Time Spent in minutes  15  See all Orders from today for further details   Oren Binet M.D on 10/06/2019 at 1:32 PM  To page go to [email or web address] - use universal password  [facility] -  Office  [phone]    Objective:   Vitals:   10/06/19 1117 10/06/19 1118 10/06/19 1130 10/06/19 1131  BP:    (!) 125/92  Pulse: 99 98 (!) 105   Resp:   18   Temp:   98.6 F (37 C)   TempSrc:   Oral   SpO2:      Weight:      Height:        Wt Readings from Last 3 Encounters:  10/06/19 53.1 kg     Intake/Output Summary (Last 24 hours) at 10/06/2019 1332 Last data filed at 10/05/2019 1525 Gross per 24 hour    Intake 538 ml  Output --  Net 538 ml     Physical Exam Gen Exam:Alert awake-not in any distress HEENT:atraumatic, normocephalic Chest: B/L clear to auscultation anteriorly CVS:S1S2 regular Abdomen:soft non tender, non distended Extremities:no edema Neurology: Non focal Skin: no rash   Data Review:    CBC Recent Labs  Lab 09/30/19 0619 10/01/19 0415 10/02/19 1040 10/04/19 0046  WBC 7.8 7.2 9.9 5.3  HGB 11.1* 10.7* 11.8* 10.2*  HCT 34.6* 34.2* 38.3 32.6*  PLT 278 256 291 253  MCV 94.5 96.6 97.0 95.6  MCH 30.3 30.2 29.9 29.9  MCHC 32.1 31.3 30.8 31.3  RDW 14.8 14.9 15.0 15.4    Chemistries  Recent Labs  Lab 09/30/19 0619 10/01/19 0415 10/02/19 0515 10/04/19 0046 10/05/19 0000  NA 136 137 136 138 137  K 3.5 4.5 4.1 3.4* 4.1  CL 99 102 106 105 103  CO2 25 24 20* 23 25  GLUCOSE 107* 80 87 93 106*  BUN 42* 36* 33* 27* 24*  CREATININE 1.38* 1.26* 1.14* 1.14* 1.05*  CALCIUM 8.7* 8.7* 8.4* 8.8* 9.4  MG  --   --   --   --  1.8  AST 26 26 24   --   --   ALT 25 25 20   --   --   ALKPHOS 57 56 52  --   --   BILITOT 0.9 0.7 0.7  --   --    ------------------------------------------------------------------------------------------------------------------ No results for  input(s): CHOL, HDL, LDLCALC, TRIG, CHOLHDL, LDLDIRECT in the last 72 hours.  Lab Results  Component Value Date   HGBA1C 6.1 (H) 09/20/2019   ------------------------------------------------------------------------------------------------------------------ No results for input(s): TSH, T4TOTAL, T3FREE, THYROIDAB in the last 72 hours.  Invalid input(s): FREET3 ------------------------------------------------------------------------------------------------------------------ No results for input(s): VITAMINB12, FOLATE, FERRITIN, TIBC, IRON, RETICCTPCT in the last 72 hours.  Coagulation profile No results for input(s): INR, PROTIME in the last 168 hours.  No results for input(s): DDIMER in the last  72 hours.  Cardiac Enzymes No results for input(s): CKMB, TROPONINI, MYOGLOBIN in the last 168 hours.  Invalid input(s): CK ------------------------------------------------------------------------------------------------------------------    Component Value Date/Time   BNP 29.1 09/20/2019 1415    Micro Results Recent Results (from the past 240 hour(s))  Novel Coronavirus, NAA (Hosp order, Send-out to Ref Lab; TAT 18-24 hrs     Status: Abnormal   Collection Time: 09/30/19  2:38 PM   Specimen: Nasopharyngeal Swab; Respiratory  Result Value Ref Range Status   SARS-CoV-2, NAA DETECTED (A) NOT DETECTED Final    Comment: (NOTE)                  Client Requested Flag This nucleic acid amplification test was developed and its performance characteristics determined by [employer]. Nucleic acid amplification tests include PCR and TMA. This test has not been FDA cleared or approved. This test has been authorized by FDA under an Emergency Use Authorization (EUA). This test is only authorized for the duration of time the declaration that circumstances exist justifying the authorization of the emergency use of in vitro diagnostic tests for detection of SARS-CoV-2 virus and/or diagnosis of COVID-19 infection under section 564(b)(1) of the Act, 21 U.S.C. [id number]) (1), unless the authorization is terminated or revoked sooner. When diagnostic testing is negative, the possibility of a false negative result should be considered in the context of a patient's recent exposures and the presence of clinical signs and symptoms consistent with COVID-19. An individual without symptoms of COVID-  19 and who is not shedding SARS-CoV-2 virus would expect to have a negative (not detected) result in this assay. Performed At: [facility] [place], [place] [id number] Rush Farmer MD [id number]    [place]  Final    Comment: Performed at [facility] [address], [place], [place] [address]    Radiology Reports CT HEAD WO CONTRAST  Result Date: 09/23/2019 CLINICAL DATA:  Acute speech disturbance.  Coronavirus infection. EXAM: CT HEAD WITHOUT CONTRAST TECHNIQUE: Contiguous axial images were obtained from the base of the skull through the vertex without intravenous contrast. COMPARISON:  None. FINDINGS: Brain: Age related atrophy. Chronic small-vessel ischemic change of the pons. No focal cerebellar insult. 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 No sign of acute infarction, mass lesion, hemorrhage, hydrocephalus or extra-axial collection. Vascular: There is atherosclerotic calcification of the major vessels at the base of the brain. Skull: Negative Sinuses/Orbits: Clear/normal Other: None IMPRESSION: No acute finding by CT. 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 No sign of acute insult at this time. Electronically Signed   By: Nelson Chimes M.D.   On: 09/23/2019 09:43   MR ANGIO HEAD WO CONTRAST  Result Date: 09/26/2019 CLINICAL DATA:  Expressive aphasia and facial droop following extubation. EXAM: MRI HEAD WITHOUT CONTRAST MRA HEAD WITHOUT CONTRAST TECHNIQUE: Multiplanar, multiecho pulse  sequences of the brain and surrounding structures were obtained without intravenous contrast. Angiographic images of the head were obtained using MRA technique without contrast. COMPARISON:  None. FINDINGS: The examination had to be discontinued prior to completion due to patient mental status and inability to cooperate with the technologist's instructions. Additionally, the examination is degraded by motion. Nine series are provided for interpretation. MRI HEAD FINDINGS BRAIN: There is no acute infarct, acute hemorrhage or extra-axial collection. Diffuse confluent  hyperintense T2-weighted signal within the periventricular, deep and juxtacortical white matter, most commonly due to chronic ischemic microangiopathy. There is generalized atrophy without lobar predilection. The midline structures are normal. VASCULAR: The major intracranial arterial and venous sinus flow voids are normal. Susceptibility-sensitive sequences show no chronic microhemorrhage or superficial siderosis. SKULL AND UPPER CERVICAL SPINE: Calvarial bone marrow signal is normal. There is no skull base mass. The visualized upper cervical spine and soft tissues are normal. SINUSES/ORBITS: There are no fluid levels or advanced mucosal thickening. The mastoid air cells and middle ear cavities are free of fluid. The orbits are normal. MRA HEAD FINDINGS The MRA portion of the examination is severely motion degraded. There is no antegrade flow demonstrated in the left ICA. The bilateral anterior and middle cerebral arteries are normal. The basilar artery and the posterior cerebral arteries are patent. IMPRESSION: 1. Motion degraded examination with no acute ischemia. 2. 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 3. Severely motion degraded MRA without antegrade flow in the left ICA. By history, the patient has a known left ICA occlusion. 4. MRA of the neck was not performed due to difficulties described above. CTA of the neck may provide better characterization with a shorter imaging time. Electronically Signed   By: Ulyses Jarred M.D.   On: 09/26/2019 00:02   MR BRAIN WO CONTRAST  Result Date: 09/26/2019 CLINICAL DATA:  Expressive aphasia and facial droop following extubation. EXAM: MRI HEAD WITHOUT CONTRAST MRA HEAD WITHOUT CONTRAST TECHNIQUE: Multiplanar, multiecho pulse sequences of the brain and surrounding structures were obtained without intravenous contrast. Angiographic images of the head were obtained using MRA technique without contrast. COMPARISON:  None. FINDINGS: The examination had to be  discontinued prior to completion due to patient mental status and inability to cooperate with the technologist's instructions. Additionally, the examination is degraded by motion. Nine series are provided for interpretation. MRI HEAD FINDINGS BRAIN: There is no acute infarct, acute hemorrhage or extra-axial collection. Diffuse confluent hyperintense T2-weighted signal within the periventricular, deep and juxtacortical white matter, most commonly due to chronic ischemic microangiopathy. There is generalized atrophy without lobar predilection. The midline structures are normal. VASCULAR: The major intracranial arterial and venous sinus flow voids are normal. Susceptibility-sensitive sequences show no chronic microhemorrhage or superficial siderosis. SKULL AND UPPER CERVICAL SPINE: Calvarial bone marrow signal is normal. There is no skull base mass. The visualized upper cervical spine and soft tissues are normal. SINUSES/ORBITS: There are no fluid levels or advanced mucosal thickening. The mastoid air cells and middle ear cavities are free of fluid. The orbits are normal. MRA HEAD FINDINGS The MRA portion of the examination is severely motion degraded. There is no antegrade flow demonstrated in the left ICA. The bilateral anterior and middle cerebral arteries are normal. The basilar artery and the posterior cerebral arteries are patent. IMPRESSION: 1. Motion degraded examination with no acute ischemia. 2. 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 3. Severely motion degraded MRA without antegrade flow in the left ICA. By history, the patient has a known  left ICA occlusion. 4. MRA of the neck was not performed due to difficulties described above. CTA of the neck may provide better characterization with a shorter imaging time. Electronically Signed   By: Ulyses Jarred M.D.   On: 09/26/2019 00:02   DG CHEST PORT 1 VIEW  Result Date: 10/03/2019 CLINICAL DATA:  Acute respiratory disease, COVID positive EXAM:  PORTABLE CHEST 1 VIEW COMPARISON:  09/28/2019 FINDINGS: Right PICC line tip SVC RA junction level. Stable heart size and vascularity. Negative for edema or effusion. No pneumothorax. Given changes in technique, stable patchy mixed airspace and interstitial opacities throughout both lungs, slightly worse on the right. Trachea midline. Aorta atherosclerotic. Remote left mastectomy and axillary lymph node dissection. Degenerative changes of the spine with a mild scoliosis. IMPRESSION: Stable patchy bilateral airspace and interstitial opacities, worse on the right. No significant interval change. Electronically Signed   By: Jerilynn Mages.  Shick M.D.   On: 10/03/2019 08:12   DG CHEST PORT 1 VIEW  Result Date: 09/28/2019 CLINICAL DATA:  CHF. EXAM: PORTABLE CHEST 1 VIEW COMPARISON:  09/23/2019.  09/20/2019. FINDINGS: Right PICC line noted with tip over superior vena cava. Stable cardiomegaly. No pulmonary venous congestion. Diffuse bilateral pulmonary infiltrates. Slight worsening from prior exam. No pleural effusion or pneumothorax. Prior left mastectomy. Surgical clips over upper abdomen. Degenerative changes thoracic spine. IMPRESSION: 1.  Right PICC line noted with tip over superior vena cava. 2. Diffuse bilateral pulmonary interstitial infiltrates. Slight worsening from prior exam. Electronically Signed   By: Marcello Moores  Register   On: 09/28/2019 08:15   DG Chest Port 1 View  Result Date: 09/23/2019 CLINICAL DATA:  Hypoxia EXAM: PORTABLE CHEST - 1 VIEW COMPARISON:  09/20/2019 FINDINGS: Patient has been extubated and the nasogastric tube removed. Right arm PICC stable. Persistent interstitial opacities throughout both mid lung fields. Slight worsening of patchy airspace opacities in both lung bases. Heart size stable.  Aortic Atherosclerosis (ICD10-170.0). No definite effusion. No pneumothorax. Visualized bones unremarkable. IMPRESSION: 1. Slight worsening of patchy airspace opacities in both lung bases. 2. Stable  interstitial opacities throughout both lungs. 3. Stable support apparatus. 4. Aortic Atherosclerosis (ICD10-170.0). Electronically Signed   By: Lucrezia Europe M.D.   On: 09/23/2019 14:25   Portable chest 1 View  Result Date: 09/20/2019 CLINICAL DATA:  Shortness of breath EXAM: PORTABLE CHEST 1 VIEW COMPARISON:  None. FINDINGS: Patchy areas of interstitial airspace disease throughout the right lung and to lesser extent left mid lung. No pleural effusion or pneumothorax. Stable cardiomediastinal silhouette. No aggressive osseous lesion. Endotracheal tube with the tip 6 cm above the carina. Right-sided PICC line with the tip projecting over the SVC. Nasogastric tube coursing below the diaphragm. No acute osseous abnormality. IMPRESSION: 1. Patchy areas of interstitial airspace disease throughout the right lung and to lesser extent left mid lung concerning for atypical viral pneumonia. Electronically Signed   By: Kathreen Devoid   On: 09/20/2019 13:45   ECHOCARDIOGRAM COMPLETE  Result Date: 09/21/2019   ECHOCARDIOGRAM REPORT   Patient Name:   VERNEL Deleeuw Date of Exam: 09/20/2019 Medical Rec #:  [id number]       Height:       67.0 in Accession #:    [id number]      Weight:       163.1 lb Date of Birth:  30-May-1941       BSA:          1.85 m Patient Age:    6 years  BP:           74/50 mmHg Patient Gender: F               HR:           83 bpm. Exam Location:  Inpatient Procedure: 2D Echo Indications:    Dyspnea 786.09 / R06.00  History:        Patient has no prior history of Echocardiogram examinations.                 Risk Factors:Hypertension and Former Smoker. COVID                 pneumoniaChronic kidney disease, carotid stenosis, past history                 of breast cancer, mastectomy and chemotherapy, unprovoked DVT.  Sonographer:    Darlina Sicilian RDCS Referring Phys: [place]  1. Technically difficult study. Left ventricular ejection fraction, by visual estimation, is 35 to  40%. The left ventricle has moderately decreased function. There is mildly increased left ventricular hypertrophy. Inferior/septal akinesis  2. Left ventricular diastolic parameters are consistent with Grade I diastolic dysfunction (impaired relaxation).  3. Global right ventricle has normal systolic function. The right ventricular size is normal.  4. Left atrial size was normal.  5. Right atrial size was not well visualized.  6. The mitral valve is normal in structure. Trace mitral valve regurgitation.  7. The tricuspid valve is not well visualized. Tricuspid valve regurgitation is trivial.  8. The aortic valve was not well visualized. Aortic valve regurgitation is mild. No evidence of aortic valve stenosis.  9. The pulmonic valve was not well visualized. Pulmonic valve regurgitation is not visualized. FINDINGS  Left Ventricle: Left ventricular ejection fraction, by visual estimation, is 35 to 40%. The left ventricle has moderately decreased function. There is mildly increased left ventricular hypertrophy. Left ventricular diastolic parameters are consistent with Grade I diastolic dysfunction (impaired relaxation). Right Ventricle: The right ventricular size is not well visualized. No increase in right ventricular wall thickness. Global RV systolic function is has normal systolic function. Left Atrium: Left atrial size was normal in size. Right Atrium: Right atrial size was not well visualized Pericardium: There is no evidence of pericardial effusion. Presence of pericardial fat pad. Mitral Valve: The mitral valve is normal in structure. Trace mitral valve regurgitation. Tricuspid Valve: The tricuspid valve is not well visualized. Tricuspid valve regurgitation is trivial. Aortic Valve: The aortic valve was not well visualized. Aortic valve regurgitation is mild. The aortic valve is structurally normal, with no evidence of sclerosis or stenosis. Pulmonic Valve: The pulmonic valve was not well visualized. Pulmonic  valve regurgitation is not visualized. Aorta: The aortic root is normal in size and structure. IAS/Shunts: The interatrial septum was not well visualized.  LEFT VENTRICLE PLAX 2D LVOT diam:     2.00 cm       Diastology LVOT Area:     3.14 cm      LV e' lateral:   7.18 cm/s                              LV E/e' lateral: 6.7                              LV e' medial:    4.03 cm/s LV Volumes (MOD)  LV E/e' medial:  11.9 LV area d, A2C:    27.90 cm LV area d, A4C:    30.40 cm LV area s, A2C:    21.60 cm LV area s, A4C:    20.90 cm LV major d, A2C:   8.53 cm LV major d, A4C:   8.04 cm LV major s, A2C:   7.81 cm LV major s, A4C:   7.36 cm LV vol d, MOD A2C: 74.2 ml LV vol d, MOD A4C: 94.4 ml LV vol s, MOD A2C: 51.0 ml LV vol s, MOD A4C: 49.2 ml LV SV MOD A2C:     23.2 ml LV SV MOD A4C:     94.4 ml LV SV MOD BP:      34.7 ml LEFT ATRIUM           Index LA Vol (A2C): 23.2 ml 12.51 ml/m LA Vol (A4C): 20.9 ml 11.27 ml/m  AORTIC VALVE LVOT Vmax:   76.90 cm/s LVOT Vmean:  36.500 cm/s LVOT VTI:    0.076 m MITRAL VALVE MV Area (PHT): 3.08 cm             SHUNTS MV PHT:        71.34 msec           Systemic VTI:  0.08 m MV Decel Time: 246 msec             Systemic Diam: 2.00 cm MV E velocity: 48.00 cm/s 103 cm/s MV A velocity: 96.00 cm/s 70.3 cm/s MV E/A ratio:  0.50       1.5  Oswaldo Milian MD Electronically signed by Oswaldo Milian MD Signature Date/Time: 09/21/2019/12:17:47 AM    Final

## 2019-10-06 NOTE — Plan of Care (Signed)
Patient remained Aox4, no complaints per patient, No prns needed, Vitals remained wnl, pending snf placment. Problem: Education: Goal: Knowledge of General Education information will improve Description: Including pain rating scale, medication(s)/side effects and non-pharmacologic comfort measures Outcome: Progressing   Problem: Health Behavior/Discharge Planning: Goal: Ability to manage health-related needs will improve Outcome: Progressing   Problem: Clinical Measurements: Goal: Ability to maintain clinical measurements within normal limits will improve Outcome: Progressing Goal: Will remain free from infection Outcome: Progressing Goal: Diagnostic test results will improve Outcome: Progressing Goal: Respiratory complications will improve Outcome: Progressing Goal: Cardiovascular complication will be avoided Outcome: Progressing   Problem: Activity: Goal: Risk for activity intolerance will decrease Outcome: Progressing   Problem: Nutrition: Goal: Adequate nutrition will be maintained Outcome: Progressing   Problem: Coping: Goal: Level of anxiety will decrease Outcome: Progressing   Problem: Elimination: Goal: Will not experience complications related to bowel motility Outcome: Progressing Goal: Will not experience complications related to urinary retention Outcome: Progressing   Problem: Pain Managment: Goal: General experience of comfort will improve Outcome: Progressing   Problem: Safety: Goal: Ability to remain free from injury will improve Outcome: Progressing   Problem: Skin Integrity: Goal: Risk for impaired skin integrity will decrease Outcome: Progressing   Problem: Education: Goal: Knowledge of risk factors and measures for prevention of condition will improve Outcome: Progressing   Problem: Coping: Goal: Psychosocial and spiritual needs will be supported Outcome: Progressing   Problem: Respiratory: Goal: Will maintain a patent airway Outcome:  Progressing Goal: Complications related to the disease process, condition or treatment will be avoided or minimized Outcome: Progressing

## 2019-10-06 NOTE — Progress Notes (Signed)
Inpatient Rehab Admissions Coordinator:   Received insurance denial for request for authorization for CIR.  I will let family know today.    Shann Medal, PT, DPT Admissions Coordinator [phone] 10/06/19  9:07 AM

## 2019-10-07 LAB — GLUCOSE, CAPILLARY
Glucose-Capillary: 79 mg/dL (ref 70–99)
Glucose-Capillary: 80 mg/dL (ref 70–99)

## 2019-10-07 MED ORDER — QUETIAPINE FUMARATE 25 MG PO TABS: 75.0000 mg | ORAL_TABLET | Freq: Two times a day (BID) | ORAL | Status: AC

## 2019-10-07 MED ORDER — CLONAZEPAM 0.5 MG PO TABS: 0.5000 mg | ORAL_TABLET | Freq: Three times a day (TID) | ORAL | 0 refills | Status: AC | PRN

## 2019-10-07 MED ORDER — METOPROLOL TARTRATE 25 MG PO TABS
25.0000 mg | ORAL_TABLET | Freq: Three times a day (TID) | ORAL | Status: DC
  Administered 2019-10-07: 10:00:00 25 mg via ORAL

## 2019-10-07 MED ORDER — FUROSEMIDE 20 MG PO TABS: 20.0000 mg | ORAL_TABLET | Freq: Every day | ORAL | 11 refills | Status: AC

## 2019-10-07 MED ORDER — ONDANSETRON HCL 4 MG PO TABS: 4.0000 mg | ORAL_TABLET | Freq: Three times a day (TID) | ORAL | 1 refills | Status: AC | PRN

## 2019-10-07 MED ORDER — PANTOPRAZOLE SODIUM 40 MG PO TBEC: 40.0000 mg | DELAYED_RELEASE_TABLET | Freq: Every day | ORAL | Status: AC

## 2019-10-07 MED ORDER — METOPROLOL TARTRATE 50 MG PO TABS: 50.0000 mg | ORAL_TABLET | Freq: Two times a day (BID) | ORAL | Status: DC

## 2019-10-07 MED ORDER — ENSURE ENLIVE PO LIQD: 237.0000 mL | Freq: Three times a day (TID) | ORAL | 12 refills | Status: AC

## 2019-10-07 NOTE — Discharge Summary (Signed)
PATIENT DETAILS Name: Christie Harper Age: 78 y.o. Sex: female Date of Birth: [birth year] MRN: [id number]. Admitting Physician: Juanito Doom, MD [id number], Pcp Not In  Admit Date: 09/20/2019 Discharge date: 10/07/2019  Recommendations for Outpatient Follow-up:  1. Follow up with PCP in 1-2 weeks 2. Please obtain CMP/CBC in one week 3. Repeat Chest Xray in 4-6 week 4. Please ensure outpatient follow-up with cardiology   Admitted From:  Home  Disposition: SNF   Home Health: No  Equipment/Devices: None  Discharge Condition: Stable  CODE STATUS: Partial code-ok to intubate  Diet recommendation:  Diet Order            Diet - low sodium heart healthy        DIET DYS 3 Room service appropriate? Yes; Fluid consistency: Thin  Diet effective now               Brief Summary: See H&P, Labs, Consult and Test reports for all details in brief,78 year old female with past medical history of HTN, breast cancer-s/p mastectomy/chemotherapy, CKD stage IIIa, VTE on Xarelto-who presented to [facility] on 11/21 with diarrhea and mild hypoxemia.  She was started on steroids and remdesivir, but continued to develop progressive hypoxemia-and was intubated.  She was subsequently transferred to [facility] managed in the ICU per [facility] protocol.  Patient was liberated from the ventilator on 12/4.  She has had a prolonged hospitalization-complicated by bacterial pneumonia, acute focal neurological deficit with slurred speech  (negative MRI for acute CVA ), and acute on chronic systolic heart failure.  See below for further details.  Significant events: 11/21 admit [place] hospital diarrhea, mild hypoxemia 11/22 COVID test positive, started remdesivir and decadron 11/24 4-8 L O2 11/30 started heated high flow O2, new fever, intubated 12/2 transfer to [facility] 12/4 extubated 12/6 facial droop/slurred speech 12/7 MRI brain: Negative for infarct.  Brief  Hospital Course: Acute hypoxic respiratory failure due to SARS COVID-19 viral pneumonia/secondary bacterial infection: Significantly improved-she is on just 2L of oxygen.  She has finished a course of antibiotics, remdesivir/steroids.  Continue supportive care.  COVID-19 Labs:  No results for input(s): DDIMER, FERRITIN, LDH, CRP in the last 72 hours.  Lab Results  Component Value Date   [place] (A) 09/30/2019       11/19 SARS COV 2 > positive (performed at her PCP office)  COVID-19 Medications: Decadron 11/22> 12/1 (at outside hospital) Remdesivir 11/22> 11/26 (at outside hospital)  Other medications: Diuretics:Euvolemic-no need for lasix Antibiotics: Ceftriaxone 11/24 > 11/28 (at outside hospital) Azithro 11/24 > 11/28 (at outside hospital) Cefepime: 12/3>> [id number]  Acute metabolic encephalopathy: Improved-confusion slowly continues to abate.  This morning she is very alert and awake. Suspect etiology to be lingering ICU delirium-probably had some mild cognitive dysfunction at baseline.  Continue supportive care with Seroquel.  Due to anxiety issues-she was started on Klonopin with good response.  MRI brain without any acute abnormalities.  Suspected TIA: Developed transient slurred speech on 12/6-MRI brain did not show any acute findings.  Continue aspirin/statin  Essential hypertension: Controlled-continue metoprolol Continue to monitor blood pressures. Noted to be on metoprolol.  Chronic systolic CHF (EF [id number] by TTE on 12/2): Euvolemic on exam-will resume Lasix. Continue beta-blocker.  Reviewed records in care everywhere-she follows with cardiology at [facility] appears she has a prior history of diastolic heart failure.  Patient will need further work-up to be done in the outpatient setting.  Dyslipidemia: Continue statin  History of unprovoked VTE: Continue Xarelto  History of coronary artery disease: No anginal symptoms: Continue aspirin/statin  and beta-blocker.  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  History of PAD-s/p bilateral iliac and femoral stenting: Stable-continue aspirin/statin.  History of breast cancer-s/p modified radical mastectomy 2005-continue outpatient follow-up with oncology at [facility].  Diabetes mellitus type 2 (A1c 6.1): CBG stable-monitor closely.  Due to episodes of hypoglycemia-SSI has been discontinued.  CKD stage 3b: Creatinine close to baseline-follow.  Normocytic anemia: Hemoglobin stable-follow periodically.  Debility/deconditioning: Secondary to acute illness-rehab services recommending CIR-but denied insurance authorization-plans are for SNF.   Nutrition Problem: Nutrition Problem: Increased nutrient needs Etiology: acute illness(COVID-19) Signs/Symptoms: estimated needs Interventions: Ensure Enlive (each supplement provides 350kcal and 20 grams of protein), Magic cup, Hormel Shake   RN pressure injury documentation: Pressure Injury 09/22/19 Other (Comment) Right;Distal;Lateral dark purple spot r/t ETT (Active)  09/22/19 1400  Location: Other (Comment) (tongue)  Location Orientation: Right;Distal;Lateral  Staging:   Wound Description (Comments): dark purple spot r/t ETT  Present on Admission: No    Procedures/Studies: None  Discharge Diagnoses:  Active Problems:   Acute respiratory disease due to COVID-19 virus   Peripheral arterial disease ([place])   Hypertension   Former smoker   Carotid artery disease ([place])   Cardiomyopathy ([place])   Breast cancer [facility])   Discharge Instructions:    Person Under Monitoring Name: Glynnis Qualls  Location: [address] [place] [address]   Infection Prevention Recommendations for Individuals Confirmed to have, or Being Evaluated for, 2019 Novel Coronavirus (COVID-19) Infection Who Receive Care at Home  Individuals who are  confirmed to have, or are being evaluated for, COVID-19 should follow the prevention steps below until a healthcare provider or local or state health department says they can return to normal activities.  Stay home except to get medical care You should restrict activities outside your home, except for getting medical care. Do not go to work, school, or public areas, and do not use public transportation or taxis.  Call ahead before visiting your doctor Before your medical appointment, call the healthcare provider and tell them that you have, or are being evaluated for, COVID-19 infection. This will help the healthcare provider's office take steps to keep other people from getting infected. Ask your healthcare provider to call the local or state health department.  Monitor your symptoms Seek prompt medical attention if your illness is worsening (e.g., difficulty breathing). Before going to your medical appointment, call the healthcare provider and tell them that you have, or are being evaluated for, COVID-19 infection. Ask your healthcare provider to call the local or state health department.  Wear a facemask You should wear a facemask that covers your nose and mouth when you are in the same room with other people and when you visit a healthcare provider. People who live with or visit you should also wear a facemask while they are in the same room with you.  Separate yourself from other people in your home As much as possible, you should stay in a different room from other people in your home. Also, you should use a separate bathroom, if available.  Avoid sharing household items You should not share dishes, drinking glasses, cups, eating utensils, towels, bedding, or other items with other people in your home. After using these items, you should wash them thoroughly with soap and water.  Cover your coughs and sneezes Cover your mouth and nose with a  tissue when you cough or sneeze, or  you can cough or sneeze into your sleeve. Throw used tissues in a lined trash can, and immediately wash your hands with soap and water for at least 20 seconds or use an alcohol-based hand rub.  Wash your [facility] your hands often and thoroughly with soap and water for at least 20 seconds. You can use an alcohol-based hand sanitizer if soap and water are not available and if your hands are not visibly dirty. Avoid touching your eyes, nose, and mouth with unwashed hands.   Prevention Steps for Caregivers and Household Members of Individuals Confirmed to have, or Being Evaluated for, COVID-19 Infection Being Cared for in the Home  If you live with, or provide care at home for, a person confirmed to have, or being evaluated for, COVID-19 infection please follow these guidelines to prevent infection:  Follow healthcare provider's instructions Make sure that you understand and can help the patient follow any healthcare provider instructions for all care.  Provide for the patient's basic needs You should help the patient with basic needs in the home and provide support for getting groceries, prescriptions, and other personal needs.  Monitor the patient's symptoms If they are getting sicker, call his or her medical provider and tell them that the patient has, or is being evaluated for, COVID-19 infection. This will help the healthcare provider's office take steps to keep other people from getting infected. Ask the healthcare provider to call the local or state health department.  Limit the number of people who have contact with the patient  If possible, have only one caregiver for the patient.  Other household members should stay in another home or place of residence. If this is not possible, they should stay  in another room, or be separated from the patient as much as possible. Use a separate bathroom, if available.  Restrict visitors who do not have an essential need to be in the  home.  Keep older adults, very young children, and other sick people away from the patient Keep older adults, very young children, and those who have compromised immune systems or chronic health conditions away from the patient. This includes people with chronic heart, lung, or kidney conditions, diabetes, and cancer.  Ensure good ventilation Make sure that shared spaces in the home have good air flow, such as from an air conditioner or an opened window, weather permitting.  Wash your hands often  Wash your hands often and thoroughly with soap and water for at least 20 seconds. You can use an alcohol based hand sanitizer if soap and water are not available and if your hands are not visibly dirty.  Avoid touching your eyes, nose, and mouth with unwashed hands.  Use disposable paper towels to dry your hands. If not available, use dedicated cloth towels and replace them when they become wet.  Wear a facemask and gloves  Wear a disposable facemask at all times in the room and gloves when you touch or have contact with the patient's blood, body fluids, and/or secretions or excretions, such as sweat, saliva, sputum, nasal mucus, vomit, urine, or feces.  Ensure the mask fits over your nose and mouth tightly, and do not touch it during use.  Throw out disposable facemasks and gloves after using them. Do not reuse.  Wash your hands immediately after removing your facemask and gloves.  If your personal clothing becomes contaminated, carefully remove clothing and launder. Wash your hands after handling contaminated  clothing.  Place all used disposable facemasks, gloves, and other waste in a lined container before disposing them with other household waste.  Remove gloves and wash your hands immediately after handling these items.  Do not share dishes, glasses, or other household items with the patient  Avoid sharing household items. You should not share dishes, drinking glasses, cups, eating  utensils, towels, bedding, or other items with a patient who is confirmed to have, or being evaluated for, COVID-19 infection.  After the person uses these items, you should wash them thoroughly with soap and water.  Wash laundry thoroughly  Immediately remove and wash clothes or bedding that have blood, body fluids, and/or secretions or excretions, such as sweat, saliva, sputum, nasal mucus, vomit, urine, or feces, on them.  Wear gloves when handling laundry from the patient.  Read and follow directions on labels of laundry or clothing items and detergent. In general, wash and dry with the warmest temperatures recommended on the label.  Clean all areas the individual has used often  Clean all touchable surfaces, such as counters, tabletops, doorknobs, bathroom fixtures, toilets, phones, keyboards, tablets, and bedside tables, every day. Also, clean any surfaces that may have blood, body fluids, and/or secretions or excretions on them.  Wear gloves when cleaning surfaces the patient has come in contact with.  Use a diluted bleach solution (e.g., dilute bleach with 1 part bleach and 10 parts water) or a household disinfectant with a label that says EPA-registered for coronaviruses. To make a bleach solution at home, add 1 tablespoon of bleach to 1 quart (4 cups) of water. For a larger supply, add  cup of bleach to 1 gallon (16 cups) of water.  Read labels of cleaning products and follow recommendations provided on product labels. Labels contain instructions for safe and effective use of the cleaning product including precautions you should take when applying the product, such as wearing gloves or eye protection and making sure you have good ventilation during use of the product.  Remove gloves and wash hands immediately after cleaning.  Monitor yourself for signs and symptoms of illness Caregivers and household members are considered close contacts, should monitor their health, and will be  asked to limit movement outside of the home to the extent possible. Follow the monitoring steps for close contacts listed on the symptom monitoring form.   ? If you have additional questions, contact your local health department or call the epidemiologist on call at [phone] (available 24/7). ? This guidance is subject to change. For the most up-to-date guidance from CDC, please refer to their website: YouBlogs.pl    Activity:  As tolerated with Full fall precautions use walker/cane & assistance as needed  Discharge Instructions    (HEART FAILURE PATIENTS) Call MD:  Anytime you have any of the following symptoms: 1) 3 pound weight gain in 24 hours or 5 pounds in 1 week 2) shortness of breath, with or without a dry hacking cough 3) swelling in the hands, feet or stomach 4) if you have to sleep on extra pillows at night in order to breathe.   Complete by: As directed    Ambulatory referral to Physical Medicine Rehab   Complete by: As directed    Recommended CIR for patient but did not receive insurance auth; would benefit from outpatient rehabilitation   Call MD for:  difficulty breathing, headache or visual disturbances   Complete by: As directed    Call MD for:  persistant dizziness or light-headedness  Complete by: As directed    Call MD for:  persistant nausea and vomiting   Complete by: As directed    Diet - low sodium heart healthy   Complete by: As directed    Discharge instructions   Complete by: As directed    Follow with Primary MD in 1-2 weeks  Please get a complete blood count and chemistry panel checked by your Primary MD at your next visit, and again as instructed by your Primary MD.  Get Medicines reviewed and adjusted: Please take all your medications with you for your next visit with your Primary MD  Laboratory/radiological data: Please request your Primary MD to go over all hospital tests and  procedure/radiological results at the follow up, please ask your Primary MD to get all Hospital records sent to his/her office.  In some cases, they will be blood work, cultures and biopsy results pending at the time of your discharge. Please request that your primary care M.D. follows up on these results.  Also Note the following: If you experience worsening of your admission symptoms, develop shortness of breath, life threatening emergency, suicidal or homicidal thoughts you must seek medical attention immediately by calling 911 or calling your MD immediately  if symptoms less severe.  You must read complete instructions/literature along with all the possible adverse reactions/side effects for all the Medicines you take and that have been prescribed to you. Take any new Medicines after you have completely understood and accpet all the possible adverse reactions/side effects.   Do not drive when taking Pain medications or sleeping medications (Benzodaizepines)  Do not take more than prescribed Pain, Sleep and Anxiety Medications. It is not advisable to combine anxiety,sleep and pain medications without talking with your primary care practitioner  Special Instructions: If you have smoked or chewed Tobacco  in the last 2 yrs please stop smoking, stop any regular Alcohol  and or any Recreational drug use.  Wear Seat belts while driving.  Please note: You were cared for by a hospitalist during your hospital stay. Once you are discharged, your primary care physician will handle any further medical issues. Please note that NO REFILLS for any discharge medications will be authorized once you are discharged, as it is imperative that you return to your primary care physician (or establish a relationship with a primary care physician if you do not have one) for your post hospital discharge needs so that they can reassess your need for medications and monitor your lab values.   Increase activity slowly    Complete by: As directed      Allergies as of 10/07/2019   No Known Allergies     Medication List    STOP taking these medications   amLODipine 2.5 MG tablet Commonly known as: NORVASC   omeprazole 20 MG capsule Commonly known as: PRILOSEC   traZODone 50 MG tablet Commonly known as: DESYREL     TAKE these medications   alendronate 35 MG tablet Commonly known as: FOSAMAX Take 35 mg by mouth once a week.   cilostazol 50 MG tablet Commonly known as: PLETAL Take 50 mg by mouth 2 (two) times daily.   clonazePAM 0.5 MG tablet Commonly known as: KLONOPIN Take 1 tablet (0.5 mg total) by mouth 3 (three) times daily as needed (anxiety).   DULoxetine 60 MG capsule Commonly known as: CYMBALTA Take 60 mg by mouth 2 (two) times daily.   feeding supplement (ENSURE ENLIVE) Liqd Take 237 mLs by mouth 3 (three)  times daily between meals.   FeroSul 325 (65 FE) MG tablet Generic drug: ferrous sulfate Take 325 mg by mouth 2 (two) times daily.   furosemide 20 MG tablet Commonly known as: Lasix Take 1 tablet (20 mg total) by mouth daily.   metoprolol tartrate 25 MG tablet Commonly known as: LOPRESSOR Take 25 mg by mouth 2 (two) times daily.   ondansetron 4 MG tablet Commonly known as: Zofran Take 1 tablet (4 mg total) by mouth every 8 (eight) hours as needed for nausea or vomiting.   pantoprazole 40 MG tablet Commonly known as: PROTONIX Take 1 tablet (40 mg total) by mouth daily. Start taking on: October 08, 2019   QUEtiapine 25 MG tablet Commonly known as: SEROQUEL Take 3 tablets (75 mg total) by mouth 2 (two) times daily.   simvastatin 20 MG tablet Commonly known as: ZOCOR Take 20 mg by mouth daily.   Xarelto 10 MG Tabs tablet Generic drug: rivaroxaban Take 10 mg by mouth daily.      Follow-up Information    Primary care MD. Schedule an appointment as soon as possible for a visit in 2 week(s).          No Known Allergies    Consultations:    None   Other Procedures/Studies: CT HEAD WO CONTRAST  Result Date: 09/23/2019 CLINICAL DATA:  Acute speech disturbance.  Coronavirus infection. EXAM: CT HEAD WITHOUT CONTRAST TECHNIQUE: Contiguous axial images were obtained from the base of the skull through the vertex without intravenous contrast. COMPARISON:  None. FINDINGS: Brain: Age related atrophy. Chronic small-vessel ischemic change of the pons. No focal cerebellar insult. 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 No sign of acute infarction, mass lesion, hemorrhage, hydrocephalus or extra-axial collection. Vascular: There is atherosclerotic calcification of the major vessels at the base of the brain. Skull: Negative Sinuses/Orbits: Clear/normal Other: None IMPRESSION: No acute finding by CT. 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 No sign of acute insult at this time. Electronically Signed   By: Nelson Chimes M.D.   On: 09/23/2019 09:43   MR ANGIO HEAD WO CONTRAST  Result Date: 09/26/2019 CLINICAL DATA:  Expressive aphasia and facial droop following extubation. EXAM: MRI HEAD WITHOUT CONTRAST MRA HEAD WITHOUT CONTRAST TECHNIQUE: Multiplanar, multiecho pulse sequences of the brain and surrounding structures were obtained without intravenous contrast. Angiographic images of the head were obtained using MRA technique without contrast. COMPARISON:  None. FINDINGS: The examination had to be discontinued prior to completion due to patient mental status and inability to cooperate with the technologist's instructions. Additionally, the examination is degraded by motion. Nine series are provided for interpretation. MRI HEAD FINDINGS BRAIN: There is no acute infarct, acute hemorrhage or extra-axial collection. Diffuse confluent hyperintense T2-weighted signal within the periventricular,  deep and juxtacortical white matter, most commonly due to chronic ischemic microangiopathy. There is generalized atrophy without lobar predilection. The midline structures are normal. VASCULAR: The major intracranial arterial and venous sinus flow voids are normal. Susceptibility-sensitive sequences show no chronic microhemorrhage or superficial siderosis. SKULL AND UPPER CERVICAL SPINE: Calvarial bone marrow signal is normal. There is no skull base mass. The visualized upper cervical spine and soft tissues are normal. SINUSES/ORBITS: There are no fluid levels or advanced mucosal thickening. The mastoid air cells and middle ear cavities are free of fluid. The orbits are normal. MRA HEAD FINDINGS The MRA portion  of the examination is severely motion degraded. There is no antegrade flow demonstrated in the left ICA. The bilateral anterior and middle cerebral arteries are normal. The basilar artery and the posterior cerebral arteries are patent. IMPRESSION: 1. Motion degraded examination with no acute ischemia. 2. 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 3. Severely motion degraded MRA without antegrade flow in the left ICA. By history, the patient has a known left ICA occlusion. 4. MRA of the neck was not performed due to difficulties described above. CTA of the neck may provide better characterization with a shorter imaging time. Electronically Signed   By: Ulyses Jarred M.D.   On: 09/26/2019 00:02   MR BRAIN WO CONTRAST  Result Date: 09/26/2019 CLINICAL DATA:  Expressive aphasia and facial droop following extubation. EXAM: MRI HEAD WITHOUT CONTRAST MRA HEAD WITHOUT CONTRAST TECHNIQUE: Multiplanar, multiecho pulse sequences of the brain and surrounding structures were obtained without intravenous contrast. Angiographic images of the head were obtained using MRA technique without contrast. COMPARISON:  None. FINDINGS: The examination had to be discontinued prior to completion due to patient mental status  and inability to cooperate with the technologist's instructions. Additionally, the examination is degraded by motion. Nine series are provided for interpretation. MRI HEAD FINDINGS BRAIN: There is no acute infarct, acute hemorrhage or extra-axial collection. Diffuse confluent hyperintense T2-weighted signal within the periventricular, deep and juxtacortical white matter, most commonly due to chronic ischemic microangiopathy. There is generalized atrophy without lobar predilection. The midline structures are normal. VASCULAR: The major intracranial arterial and venous sinus flow voids are normal. Susceptibility-sensitive sequences show no chronic microhemorrhage or superficial siderosis. SKULL AND UPPER CERVICAL SPINE: Calvarial bone marrow signal is normal. There is no skull base mass. The visualized upper cervical spine and soft tissues are normal. SINUSES/ORBITS: There are no fluid levels or advanced mucosal thickening. The mastoid air cells and middle ear cavities are free of fluid. The orbits are normal. MRA HEAD FINDINGS The MRA portion of the examination is severely motion degraded. There is no antegrade flow demonstrated in the left ICA. The bilateral anterior and middle cerebral arteries are normal. The basilar artery and the posterior cerebral arteries are patent. IMPRESSION: 1. Motion degraded examination with no acute ischemia. 2. 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 3. Severely motion degraded MRA without antegrade flow in the left ICA. By history, the patient has a known left ICA occlusion. 4. MRA of the neck was not performed due to difficulties described above. CTA of the neck may provide better characterization with a shorter imaging time. Electronically Signed   By: Ulyses Jarred M.D.   On: 09/26/2019 00:02   DG CHEST PORT 1 VIEW  Result Date: 10/03/2019 CLINICAL DATA:  Acute respiratory disease, COVID positive EXAM: PORTABLE CHEST 1 VIEW COMPARISON:  09/28/2019 FINDINGS: Right PICC  line tip SVC RA junction level. Stable heart size and vascularity. Negative for edema or effusion. No pneumothorax. Given changes in technique, stable patchy mixed airspace and interstitial opacities throughout both lungs, slightly worse on the right. Trachea midline. Aorta atherosclerotic. Remote left mastectomy and axillary lymph node dissection. Degenerative changes of the spine with a mild scoliosis. IMPRESSION: Stable patchy bilateral airspace and interstitial opacities, worse on the right. No significant interval change. Electronically Signed   By: Jerilynn Mages.  Shick M.D.   On: 10/03/2019 08:12   DG CHEST PORT 1 VIEW  Result Date: 09/28/2019 CLINICAL DATA:  CHF. EXAM: PORTABLE CHEST 1 VIEW COMPARISON:  09/23/2019.  09/20/2019. FINDINGS: Right PICC  line noted with tip over superior vena cava. Stable cardiomegaly. No pulmonary venous congestion. Diffuse bilateral pulmonary infiltrates. Slight worsening from prior exam. No pleural effusion or pneumothorax. Prior left mastectomy. Surgical clips over upper abdomen. Degenerative changes thoracic spine. IMPRESSION: 1.  Right PICC line noted with tip over superior vena cava. 2. Diffuse bilateral pulmonary interstitial infiltrates. Slight worsening from prior exam. Electronically Signed   By: Marcello Moores  Register   On: 09/28/2019 08:15   DG Chest Port 1 View  Result Date: 09/23/2019 CLINICAL DATA:  Hypoxia EXAM: PORTABLE CHEST - 1 VIEW COMPARISON:  09/20/2019 FINDINGS: Patient has been extubated and the nasogastric tube removed. Right arm PICC stable. Persistent interstitial opacities throughout both mid lung fields. Slight worsening of patchy airspace opacities in both lung bases. Heart size stable.  Aortic Atherosclerosis (ICD10-170.0). No definite effusion. No pneumothorax. Visualized bones unremarkable. IMPRESSION: 1. Slight worsening of patchy airspace opacities in both lung bases. 2. Stable interstitial opacities throughout both lungs. 3. Stable support apparatus. 4.  Aortic Atherosclerosis (ICD10-170.0). Electronically Signed   By: Lucrezia Europe M.D.   On: 09/23/2019 14:25   Portable chest 1 View  Result Date: 09/20/2019 CLINICAL DATA:  Shortness of breath EXAM: PORTABLE CHEST 1 VIEW COMPARISON:  None. FINDINGS: Patchy areas of interstitial airspace disease throughout the right lung and to lesser extent left mid lung. No pleural effusion or pneumothorax. Stable cardiomediastinal silhouette. No aggressive osseous lesion. Endotracheal tube with the tip 6 cm above the carina. Right-sided PICC line with the tip projecting over the SVC. Nasogastric tube coursing below the diaphragm. No acute osseous abnormality. IMPRESSION: 1. Patchy areas of interstitial airspace disease throughout the right lung and to lesser extent left mid lung concerning for atypical viral pneumonia. Electronically Signed   By: Kathreen Devoid   On: 09/20/2019 13:45   ECHOCARDIOGRAM COMPLETE  Result Date: 09/21/2019   ECHOCARDIOGRAM REPORT   Patient Name:   CHERI Pritchard Date of Exam: 09/20/2019 Medical Rec #:  [id number]       Height:       67.0 in Accession #:    [id number]      Weight:       163.1 lb Date of Birth:  1941/05/25       BSA:          1.85 m Patient Age:    5 years        BP:           74/50 mmHg Patient Gender: F               HR:           83 bpm. Exam Location:  Inpatient Procedure: 2D Echo Indications:    Dyspnea 786.09 / R06.00  History:        Patient has no prior history of Echocardiogram examinations.                 Risk Factors:Hypertension and Former Smoker. COVID                 pneumoniaChronic kidney disease, carotid stenosis, past history                 of breast cancer, mastectomy and chemotherapy, unprovoked DVT.  Sonographer:    Darlina Sicilian RDCS Referring Phys: [place]  1. Technically difficult study. Left ventricular ejection fraction, by visual estimation, is 35 to 40%. The left ventricle has moderately decreased function. There is mildly  increased left  ventricular hypertrophy. Inferior/septal akinesis  2. Left ventricular diastolic parameters are consistent with Grade I diastolic dysfunction (impaired relaxation).  3. Global right ventricle has normal systolic function. The right ventricular size is normal.  4. Left atrial size was normal.  5. Right atrial size was not well visualized.  6. The mitral valve is normal in structure. Trace mitral valve regurgitation.  7. The tricuspid valve is not well visualized. Tricuspid valve regurgitation is trivial.  8. The aortic valve was not well visualized. Aortic valve regurgitation is mild. No evidence of aortic valve stenosis.  9. The pulmonic valve was not well visualized. Pulmonic valve regurgitation is not visualized. FINDINGS  Left Ventricle: Left ventricular ejection fraction, by visual estimation, is 35 to 40%. The left ventricle has moderately decreased function. There is mildly increased left ventricular hypertrophy. Left ventricular diastolic parameters are consistent with Grade I diastolic dysfunction (impaired relaxation). Right Ventricle: The right ventricular size is not well visualized. No increase in right ventricular wall thickness. Global RV systolic function is has normal systolic function. Left Atrium: Left atrial size was normal in size. Right Atrium: Right atrial size was not well visualized Pericardium: There is no evidence of pericardial effusion. Presence of pericardial fat pad. Mitral Valve: The mitral valve is normal in structure. Trace mitral valve regurgitation. Tricuspid Valve: The tricuspid valve is not well visualized. Tricuspid valve regurgitation is trivial. Aortic Valve: The aortic valve was not well visualized. Aortic valve regurgitation is mild. The aortic valve is structurally normal, with no evidence of sclerosis or stenosis. Pulmonic Valve: The pulmonic valve was not well visualized. Pulmonic valve regurgitation is not visualized. Aorta: The aortic root is normal in  size and structure. IAS/Shunts: The interatrial septum was not well visualized.  LEFT VENTRICLE PLAX 2D LVOT diam:     2.00 cm       Diastology LVOT Area:     3.14 cm      LV e' lateral:   7.18 cm/s                              LV E/e' lateral: 6.7                              LV e' medial:    4.03 cm/s LV Volumes (MOD)             LV E/e' medial:  11.9 LV area d, A2C:    27.90 cm LV area d, A4C:    30.40 cm LV area s, A2C:    21.60 cm LV area s, A4C:    20.90 cm LV major d, A2C:   8.53 cm LV major d, A4C:   8.04 cm LV major s, A2C:   7.81 cm LV major s, A4C:   7.36 cm LV vol d, MOD A2C: 74.2 ml LV vol d, MOD A4C: 94.4 ml LV vol s, MOD A2C: 51.0 ml LV vol s, MOD A4C: 49.2 ml LV SV MOD A2C:     23.2 ml LV SV MOD A4C:     94.4 ml LV SV MOD BP:      34.7 ml LEFT ATRIUM           Index LA Vol (A2C): 23.2 ml 12.51 ml/m LA Vol (A4C): 20.9 ml 11.27 ml/m  AORTIC VALVE LVOT Vmax:   76.90 cm/s LVOT Vmean:  36.500 cm/s LVOT VTI:  0.076 m MITRAL VALVE MV Area (PHT): 3.08 cm             SHUNTS MV PHT:        71.34 msec           Systemic VTI:  0.08 m MV Decel Time: 246 msec             Systemic Diam: 2.00 cm MV E velocity: 48.00 cm/s 103 cm/s MV A velocity: 96.00 cm/s 70.3 cm/s MV E/A ratio:  0.50       1.5  Oswaldo Milian MD Electronically signed by Oswaldo Milian MD Signature Date/Time: 09/21/2019/12:17:47 AM    Final      TODAY-DAY OF DISCHARGE:  Subjective:   [name] today has no headache,no chest abdominal pain,no new weakness tingling or numbness, feels much better wants to go home today.   Objective:   Blood pressure (!) 112/58, pulse (!) 105, temperature 98.8 F (37.1 C), temperature source Oral, resp. rate 20, height 5\' 7"  (1.702 m), weight 70.1 kg, SpO2 92 %. No intake or output data in the 24 hours ending 10/07/19 0959 Filed Weights   10/05/19 0413 10/06/19 0500 10/07/19 0425  Weight: 61.3 kg 53.1 kg 70.1 kg    Exam: Awake Alert, Oriented *3, No new F.N deficits,  Normal affect [place].AT,PERRAL Supple Neck,No JVD, No cervical lymphadenopathy appriciated.  Symmetrical Chest wall movement, Good air movement bilaterally, CTAB RRR,No Gallops,Rubs or new Murmurs, No Parasternal Heave +ve B.Sounds, Abd Soft, Non tender, No organomegaly appriciated, No rebound -guarding or rigidity. No Cyanosis, Clubbing or edema, No new Rash or bruise   PERTINENT RADIOLOGIC STUDIES: CT HEAD WO CONTRAST  Result Date: 09/23/2019 CLINICAL DATA:  Acute speech disturbance.  Coronavirus infection. EXAM: CT HEAD WITHOUT CONTRAST TECHNIQUE: Contiguous axial images were obtained from the base of the skull through the vertex without intravenous contrast. COMPARISON:  None. FINDINGS: Brain: Age related atrophy. Chronic small-vessel ischemic change of the pons. No focal cerebellar insult. 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 No sign of acute infarction, mass lesion, hemorrhage, hydrocephalus or extra-axial collection. Vascular: There is atherosclerotic calcification of the major vessels at the base of the brain. Skull: Negative Sinuses/Orbits: Clear/normal Other: None IMPRESSION: No acute finding by CT. 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 No sign of acute insult at this time. Electronically Signed   By: Nelson Chimes M.D.   On: 09/23/2019 09:43   MR ANGIO HEAD WO CONTRAST  Result Date: 09/26/2019 CLINICAL DATA:  Expressive aphasia and facial droop following extubation. EXAM: MRI HEAD WITHOUT CONTRAST MRA HEAD WITHOUT CONTRAST TECHNIQUE: Multiplanar, multiecho pulse sequences of the brain and surrounding structures were obtained without intravenous contrast. Angiographic images of the head were obtained using MRA technique without contrast. COMPARISON:  None. FINDINGS: The examination had to be discontinued prior to  completion due to patient mental status and inability to cooperate with the technologist's instructions. Additionally, the examination is degraded by motion. Nine series are provided for interpretation. MRI HEAD FINDINGS BRAIN: There is no acute infarct, acute hemorrhage or extra-axial collection. Diffuse confluent hyperintense T2-weighted signal within the periventricular, deep and juxtacortical white matter, most commonly due to chronic ischemic microangiopathy. There is generalized atrophy without lobar predilection. The midline structures are normal. VASCULAR: The major intracranial arterial and venous sinus flow voids are normal. Susceptibility-sensitive sequences show no chronic microhemorrhage or superficial  siderosis. SKULL AND UPPER CERVICAL SPINE: Calvarial bone marrow signal is normal. There is no skull base mass. The visualized upper cervical spine and soft tissues are normal. SINUSES/ORBITS: There are no fluid levels or advanced mucosal thickening. The mastoid air cells and middle ear cavities are free of fluid. The orbits are normal. MRA HEAD FINDINGS The MRA portion of the examination is severely motion degraded. There is no antegrade flow demonstrated in the left ICA. The bilateral anterior and middle cerebral arteries are normal. The basilar artery and the posterior cerebral arteries are patent. IMPRESSION: 1. Motion degraded examination with no acute ischemia. 2. 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 3. Severely motion degraded MRA without antegrade flow in the left ICA. By history, the patient has a known left ICA occlusion. 4. MRA of the neck was not performed due to difficulties described above. CTA of the neck may provide better characterization with a shorter imaging time. Electronically Signed   By: Ulyses Jarred M.D.   On: 09/26/2019 00:02   MR BRAIN WO CONTRAST  Result Date: 09/26/2019 CLINICAL DATA:  Expressive aphasia and facial droop following extubation. EXAM: MRI HEAD  WITHOUT CONTRAST MRA HEAD WITHOUT CONTRAST TECHNIQUE: Multiplanar, multiecho pulse sequences of the brain and surrounding structures were obtained without intravenous contrast. Angiographic images of the head were obtained using MRA technique without contrast. COMPARISON:  None. FINDINGS: The examination had to be discontinued prior to completion due to patient mental status and inability to cooperate with the technologist's instructions. Additionally, the examination is degraded by motion. Nine series are provided for interpretation. MRI HEAD FINDINGS BRAIN: There is no acute infarct, acute hemorrhage or extra-axial collection. Diffuse confluent hyperintense T2-weighted signal within the periventricular, deep and juxtacortical white matter, most commonly due to chronic ischemic microangiopathy. There is generalized atrophy without lobar predilection. The midline structures are normal. VASCULAR: The major intracranial arterial and venous sinus flow voids are normal. Susceptibility-sensitive sequences show no chronic microhemorrhage or superficial siderosis. SKULL AND UPPER CERVICAL SPINE: Calvarial bone marrow signal is normal. There is no skull base mass. The visualized upper cervical spine and soft tissues are normal. SINUSES/ORBITS: There are no fluid levels or advanced mucosal thickening. The mastoid air cells and middle ear cavities are free of fluid. The orbits are normal. MRA HEAD FINDINGS The MRA portion of the examination is severely motion degraded. There is no antegrade flow demonstrated in the left ICA. The bilateral anterior and middle cerebral arteries are normal. The basilar artery and the posterior cerebral arteries are patent. IMPRESSION: 1. Motion degraded examination with no acute ischemia. 2. 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 3. Severely motion degraded MRA without antegrade flow in the left ICA. By history, the patient has a known left ICA occlusion. 4. MRA of the neck was not  performed due to difficulties described above. CTA of the neck may provide better characterization with a shorter imaging time. Electronically Signed   By: Ulyses Jarred M.D.   On: 09/26/2019 00:02   DG CHEST PORT 1 VIEW  Result Date: 10/03/2019 CLINICAL DATA:  Acute respiratory disease, COVID positive EXAM: PORTABLE CHEST 1 VIEW COMPARISON:  09/28/2019 FINDINGS: Right PICC line tip SVC RA junction level. Stable heart size and vascularity. Negative for edema or effusion. No pneumothorax. Given changes in technique, stable patchy mixed airspace and interstitial opacities throughout both lungs, slightly worse on the right. Trachea midline. Aorta atherosclerotic. Remote left mastectomy and axillary lymph node dissection. Degenerative changes of the spine with a  mild scoliosis. IMPRESSION: Stable patchy bilateral airspace and interstitial opacities, worse on the right. No significant interval change. Electronically Signed   By: Jerilynn Mages.  Shick M.D.   On: 10/03/2019 08:12   DG CHEST PORT 1 VIEW  Result Date: 09/28/2019 CLINICAL DATA:  CHF. EXAM: PORTABLE CHEST 1 VIEW COMPARISON:  09/23/2019.  09/20/2019. FINDINGS: Right PICC line noted with tip over superior vena cava. Stable cardiomegaly. No pulmonary venous congestion. Diffuse bilateral pulmonary infiltrates. Slight worsening from prior exam. No pleural effusion or pneumothorax. Prior left mastectomy. Surgical clips over upper abdomen. Degenerative changes thoracic spine. IMPRESSION: 1.  Right PICC line noted with tip over superior vena cava. 2. Diffuse bilateral pulmonary interstitial infiltrates. Slight worsening from prior exam. Electronically Signed   By: Marcello Moores  Register   On: 09/28/2019 08:15   DG Chest Port 1 View  Result Date: 09/23/2019 CLINICAL DATA:  Hypoxia EXAM: PORTABLE CHEST - 1 VIEW COMPARISON:  09/20/2019 FINDINGS: Patient has been extubated and the nasogastric tube removed. Right arm PICC stable. Persistent interstitial opacities throughout  both mid lung fields. Slight worsening of patchy airspace opacities in both lung bases. Heart size stable.  Aortic Atherosclerosis (ICD10-170.0). No definite effusion. No pneumothorax. Visualized bones unremarkable. IMPRESSION: 1. Slight worsening of patchy airspace opacities in both lung bases. 2. Stable interstitial opacities throughout both lungs. 3. Stable support apparatus. 4. Aortic Atherosclerosis (ICD10-170.0). Electronically Signed   By: Lucrezia Europe M.D.   On: 09/23/2019 14:25   Portable chest 1 View  Result Date: 09/20/2019 CLINICAL DATA:  Shortness of breath EXAM: PORTABLE CHEST 1 VIEW COMPARISON:  None. FINDINGS: Patchy areas of interstitial airspace disease throughout the right lung and to lesser extent left mid lung. No pleural effusion or pneumothorax. Stable cardiomediastinal silhouette. No aggressive osseous lesion. Endotracheal tube with the tip 6 cm above the carina. Right-sided PICC line with the tip projecting over the SVC. Nasogastric tube coursing below the diaphragm. No acute osseous abnormality. IMPRESSION: 1. Patchy areas of interstitial airspace disease throughout the right lung and to lesser extent left mid lung concerning for atypical viral pneumonia. Electronically Signed   By: Kathreen Devoid   On: 09/20/2019 13:45   ECHOCARDIOGRAM COMPLETE  Result Date: 09/21/2019   ECHOCARDIOGRAM REPORT   Patient Name:   Christie Harper Date of Exam: 09/20/2019 Medical Rec #:  [id number]       Height:       67.0 in Accession #:    [id number]      Weight:       163.1 lb Date of Birth:  [birth year]       BSA:          1.85 m Patient Age:    24 years        BP:           74/50 mmHg Patient Gender: F               HR:           83 bpm. Exam Location:  Inpatient Procedure: 2D Echo Indications:    Dyspnea 786.09 / R06.00  History:        Patient has no prior history of Echocardiogram examinations.                 Risk Factors:Hypertension and Former Smoker. COVID                 pneumoniaChronic kidney  disease, carotid stenosis, past history  of breast cancer, mastectomy and chemotherapy, unprovoked DVT.  Sonographer:    Darlina Sicilian RDCS Referring Phys: [place]  1. Technically difficult study. Left ventricular ejection fraction, by visual estimation, is 35 to 40%. The left ventricle has moderately decreased function. There is mildly increased left ventricular hypertrophy. Inferior/septal akinesis  2. Left ventricular diastolic parameters are consistent with Grade I diastolic dysfunction (impaired relaxation).  3. Global right ventricle has normal systolic function. The right ventricular size is normal.  4. Left atrial size was normal.  5. Right atrial size was not well visualized.  6. The mitral valve is normal in structure. Trace mitral valve regurgitation.  7. The tricuspid valve is not well visualized. Tricuspid valve regurgitation is trivial.  8. The aortic valve was not well visualized. Aortic valve regurgitation is mild. No evidence of aortic valve stenosis.  9. The pulmonic valve was not well visualized. Pulmonic valve regurgitation is not visualized. FINDINGS  Left Ventricle: Left ventricular ejection fraction, by visual estimation, is 35 to 40%. The left ventricle has moderately decreased function. There is mildly increased left ventricular hypertrophy. Left ventricular diastolic parameters are consistent with Grade I diastolic dysfunction (impaired relaxation). Right Ventricle: The right ventricular size is not well visualized. No increase in right ventricular wall thickness. Global RV systolic function is has normal systolic function. Left Atrium: Left atrial size was normal in size. Right Atrium: Right atrial size was not well visualized Pericardium: There is no evidence of pericardial effusion. Presence of pericardial fat pad. Mitral Valve: The mitral valve is normal in structure. Trace mitral valve regurgitation. Tricuspid Valve: The tricuspid valve is not  well visualized. Tricuspid valve regurgitation is trivial. Aortic Valve: The aortic valve was not well visualized. Aortic valve regurgitation is mild. The aortic valve is structurally normal, with no evidence of sclerosis or stenosis. Pulmonic Valve: The pulmonic valve was not well visualized. Pulmonic valve regurgitation is not visualized. Aorta: The aortic root is normal in size and structure. IAS/Shunts: The interatrial septum was not well visualized.  LEFT VENTRICLE PLAX 2D LVOT diam:     2.00 cm       Diastology LVOT Area:     3.14 cm      LV e' lateral:   7.18 cm/s                              LV E/e' lateral: 6.7                              LV e' medial:    4.03 cm/s LV Volumes (MOD)             LV E/e' medial:  11.9 LV area d, A2C:    27.90 cm LV area d, A4C:    30.40 cm LV area s, A2C:    21.60 cm LV area s, A4C:    20.90 cm LV major d, A2C:   8.53 cm LV major d, A4C:   8.04 cm LV major s, A2C:   7.81 cm LV major s, A4C:   7.36 cm LV vol d, MOD A2C: 74.2 ml LV vol d, MOD A4C: 94.4 ml LV vol s, MOD A2C: 51.0 ml LV vol s, MOD A4C: 49.2 ml LV SV MOD A2C:     23.2 ml LV SV MOD A4C:     94.4 ml LV SV MOD BP:  34.7 ml LEFT ATRIUM           Index LA Vol (A2C): 23.2 ml 12.51 ml/m LA Vol (A4C): 20.9 ml 11.27 ml/m  AORTIC VALVE LVOT Vmax:   76.90 cm/s LVOT Vmean:  36.500 cm/s LVOT VTI:    0.076 m MITRAL VALVE MV Area (PHT): 3.08 cm             SHUNTS MV PHT:        71.34 msec           Systemic VTI:  0.08 m MV Decel Time: 246 msec             Systemic Diam: 2.00 cm MV E velocity: 48.00 cm/s 103 cm/s MV A velocity: 96.00 cm/s 70.3 cm/s MV E/A ratio:  0.50       1.5  Oswaldo Milian MD Electronically signed by Oswaldo Milian MD Signature Date/Time: 09/21/2019/12:17:47 AM    Final      PERTINENT LAB RESULTS: CBC: No results for input(s): WBC, HGB, HCT, PLT in the last 72 hours. CMET CMP     Component Value Date/Time   NA 137 10/05/2019 0000   K 4.1 10/05/2019 0000   CL 103 10/05/2019  0000   CO2 25 10/05/2019 0000   GLUCOSE 106 (H) 10/05/2019 0000   BUN 24 (H) 10/05/2019 0000   CREATININE 1.05 (H) 10/05/2019 0000   CALCIUM 9.4 10/05/2019 0000   PROT 5.5 (L) 10/02/2019 0515   ALBUMIN 2.3 (L) 10/02/2019 0515   AST 24 10/02/2019 0515   ALT 20 10/02/2019 0515   ALKPHOS 52 10/02/2019 0515   BILITOT 0.7 10/02/2019 0515   GFRNONAA 50 (L) 10/05/2019 0000   GFRAA 58 (L) 10/05/2019 0000    GFR Estimated Creatinine Clearance: 41.6 mL/min (A) (by C-G formula based on SCr of 1.05 mg/dL (H)). No results for input(s): LIPASE, AMYLASE in the last 72 hours. No results for input(s): CKTOTAL, CKMB, CKMBINDEX, TROPONINI in the last 72 hours. Invalid input(s): POCBNP No results for input(s): DDIMER in the last 72 hours. No results for input(s): HGBA1C in the last 72 hours. No results for input(s): CHOL, HDL, LDLCALC, TRIG, CHOLHDL, LDLDIRECT in the last 72 hours. No results for input(s): TSH, T4TOTAL, T3FREE, THYROIDAB in the last 72 hours.  Invalid input(s): FREET3 No results for input(s): VITAMINB12, FOLATE, FERRITIN, TIBC, IRON, RETICCTPCT in the last 72 hours. Coags: No results for input(s): INR in the last 72 hours.  Invalid input(s): PT Microbiology: Recent Results (from the past 240 hour(s))  Novel Coronavirus, NAA (Hosp order, Send-out to Ref Lab; TAT 18-24 hrs     Status: Abnormal   Collection Time: 09/30/19  2:38 PM   Specimen: Nasopharyngeal Swab; Respiratory  Result Value Ref Range Status   SARS-CoV-2, NAA DETECTED (A) NOT DETECTED Final    Comment: (NOTE)                  Client Requested Flag This nucleic acid amplification test was developed and its performance characteristics determined by [employer]. Nucleic acid amplification tests include PCR and TMA. This test has not been FDA cleared or approved. This test has been authorized by FDA under an Emergency Use Authorization (EUA). This test is only authorized for the duration of time the  declaration that circumstances exist justifying the authorization of the emergency use of in vitro diagnostic tests for detection of SARS-CoV-2 virus and/or diagnosis of COVID-19 infection under section 564(b)(1) of the Act, 21 U.S.C. [id number]) (1), unless  the authorization is terminated or revoked sooner. When diagnostic testing is negative, the possibility of a false negative result should be considered in the context of a patient's recent exposures and the presence of clinical signs and symptoms consistent with COVID-19. An individual without symptoms of COVID-  19 and who is not shedding SARS-CoV-2 virus would expect to have a negative (not detected) result in this assay. Performed At: [facility] [place], [place] [id number] Rush Farmer MD [id number]    [facility]  Final    Comment: Performed at [facility] [address], [place], [place] [address]    FURTHER DISCHARGE INSTRUCTIONS:  Get Medicines reviewed and adjusted: Please take all your medications with you for your next visit with your Primary MD  Laboratory/radiological data: Please request your Primary MD to go over all hospital tests and procedure/radiological results at the follow up, please ask your Primary MD to get all Hospital records sent to his/her office.  In some cases, they will be blood work, cultures and biopsy results pending at the time of your discharge. Please request that your primary care M.D. goes through all the records of your hospital data and follows up on these results.  Also Note the following: If you experience worsening of your admission symptoms, develop shortness of breath, life threatening emergency, suicidal or homicidal thoughts you must seek medical attention immediately by calling 911 or calling your MD immediately  if symptoms less severe.  You must read complete instructions/literature along with all the  possible adverse reactions/side effects for all the Medicines you take and that have been prescribed to you. Take any new Medicines after you have completely understood and accpet all the possible adverse reactions/side effects.   Do not drive when taking Pain medications or sleeping medications (Benzodaizepines)  Do not take more than prescribed Pain, Sleep and Anxiety Medications. It is not advisable to combine anxiety,sleep and pain medications without talking with your primary care practitioner  Special Instructions: If you have smoked or chewed Tobacco  in the last 2 yrs please stop smoking, stop any regular Alcohol  and or any Recreational drug use.  Wear Seat belts while driving.  Please note: You were cared for by a hospitalist during your hospital stay. Once you are discharged, your primary care physician will handle any further medical issues. Please note that NO REFILLS for any discharge medications will be authorized once you are discharged, as it is imperative that you return to your primary care physician (or establish a relationship with a primary care physician if you do not have one) for your post hospital discharge needs so that they can reassess your need for medications and monitor your lab values.  Total Time spent coordinating discharge including counseling, education and face to face time equals 35 minutes.  SignedOren Binet 10/07/2019 9:59 AM

## 2019-10-07 NOTE — TOC Transition Note (Signed)
Transition of Care [facility]) - CM/SW Discharge Note   Patient Details  Name: Christie Harper MRN: [id number] Date of Birth: [birth year]  [facility]) CM/SW Contact:  Geralynn Ochs, LCSW Phone Number: 10/07/2019, 10:13 AM   Clinical Narrative:   Nurse to call report to [phone], Room 214B.  Transport set for 12:30 PM.    Final next level of care: [facility] Barriers to Discharge: Barriers Resolved   Patient Goals and CMS Choice   CMS Medicare.gov Compare Post Acute Care list provided to:: Patient Represenative (must comment) Choice offered to / list presented to : Sibling  Discharge Placement              Patient chooses bed at: [facility]) Patient to be transferred to facility by: [facility] Name of family member notified: Izora Gala Patient and family notified of of transfer: 10/07/19  Discharge Plan and Services In-house Referral: Clinical Social Work   Post Acute Care Choice: [facility]                               Social Determinants of Health (SDOH) Interventions     Readmission Risk Interventions No flowsheet data found.

## 2019-10-07 NOTE — Progress Notes (Signed)
Pt alert with confusion and some agitation, prn Klonipin given as ordered.  O2@2L /min via nasal canula, Sats 94%, SOB on exertion, PICC line removed intact from RUA, vaseline gauze and pressure dressing in place, some bleeding noted prior to applying dressing. All belongings sent with pt , cane and RW. Report given to [facility] @SNF . Pt picked up by [facility] to transfer to facility. Denies pain or discomfort prior to leaving, VSS.

## 2020-08-12 IMAGING — MR MR HEAD W/O CM
9 of 10 series · 40 of 48 positions shown · non-contrast
Comparison: None.

CLINICAL DATA: Expressive aphasia and facial droop following
extubation.

EXAM:
MRI HEAD WITHOUT CONTRAST
MRA HEAD WITHOUT CONTRAST
TECHNIQUE: Multiplanar, multiecho pulse sequences of the brain and surrounding
structures were obtained without intravenous contrast. Angiographic
images of the head were obtained using MRA technique without
contrast.

[Series 3: DWI · axial · 3.0mm · 1.09mm/px · z∈[-69,+73]mm · 7 of 100 slices shown (1 of 6)]
[im 1/100]
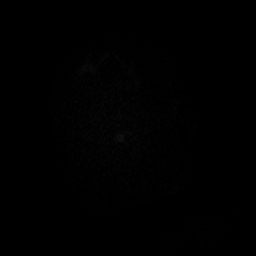
[im 17/100]
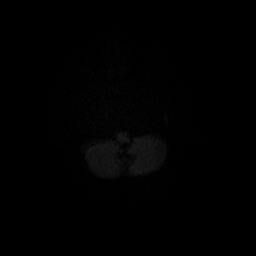
[im 34/100]
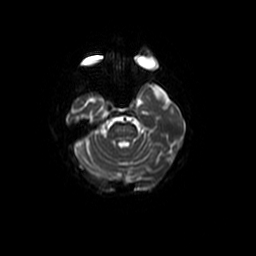
[im 50/100]
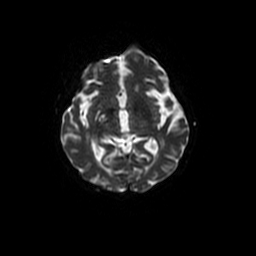
[im 67/100]
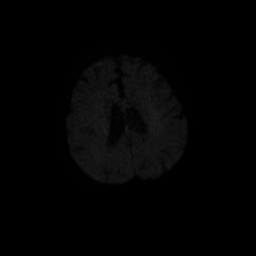
[im 83/100]
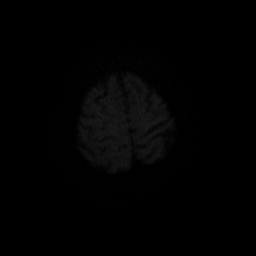
[im 100/100]
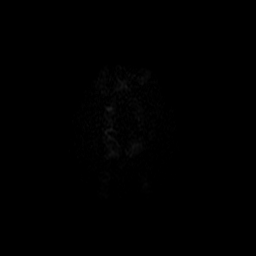

[Series 4: DWI · coronal · 5.0mm · 1.09mm/px · 5 of 68 slices shown (2 of 6)]
[im 1/68]
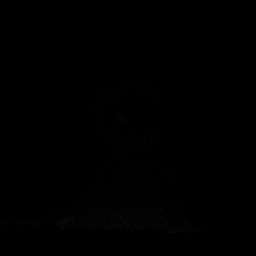
[im 17/68]
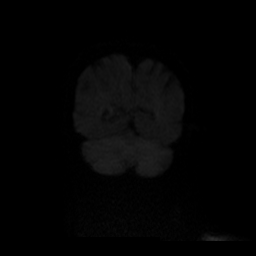
[im 34/68]
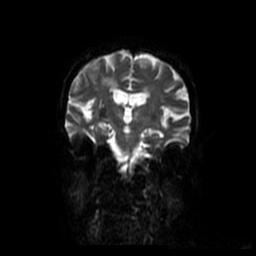
[im 51/68]
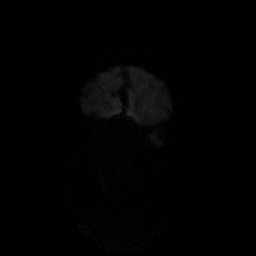
[im 68/68]
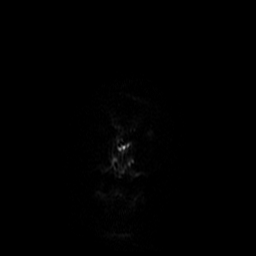

[Series 5: (id) mt fs · axial · 1.4mm · 0.43mm/px · z∈[-45,+11]mm · 5 of 154 slices shown]
[im 1/154]
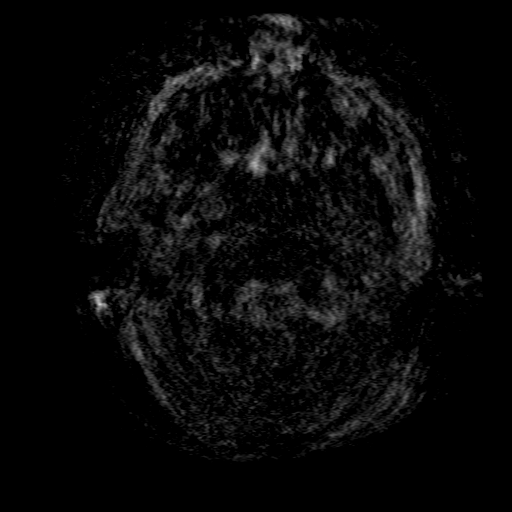
[im 28/154]
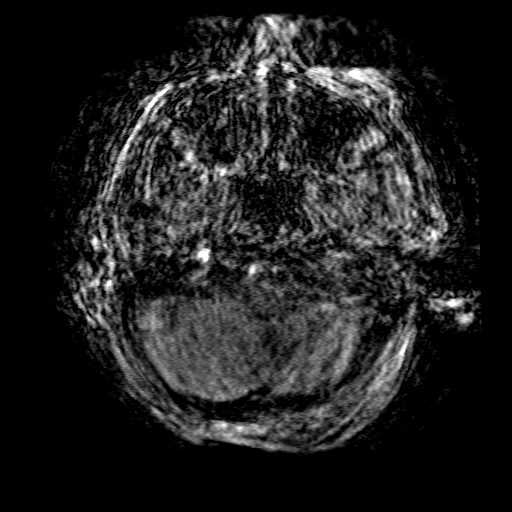
[im 42/154]
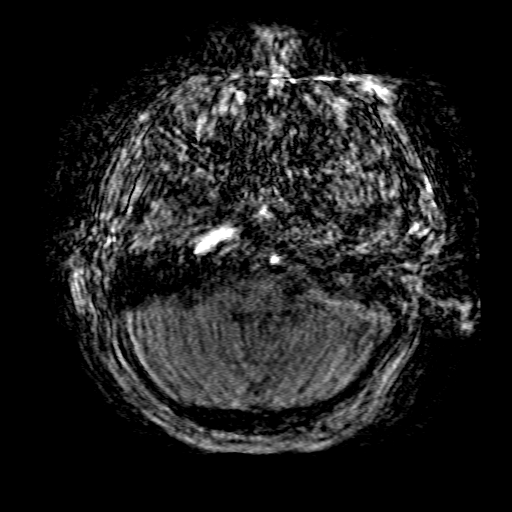
[im 70/154]
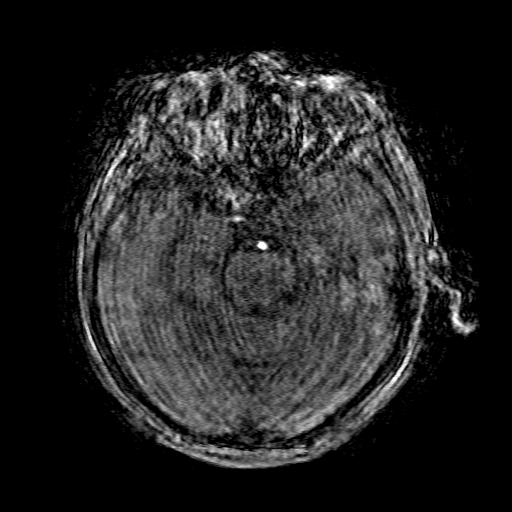
[im 84/154]
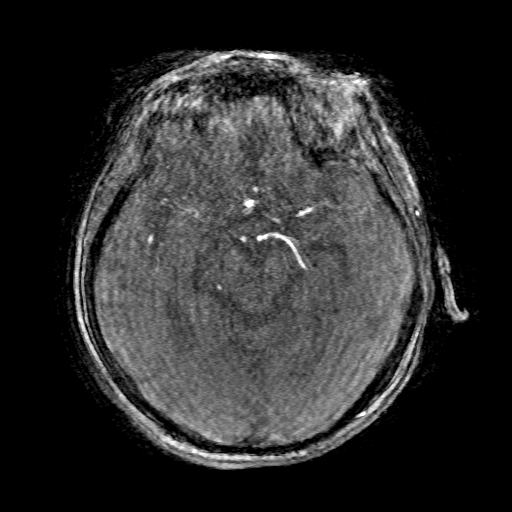

[Series 6: FLAIR · axial · 3.0mm · 0.43mm/px · z∈[-81,+67]mm · 2 of 26 slices shown]
[im 1/26]
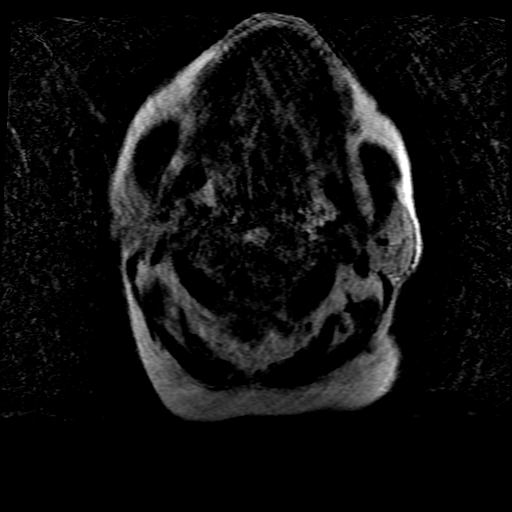
[im 26/26]
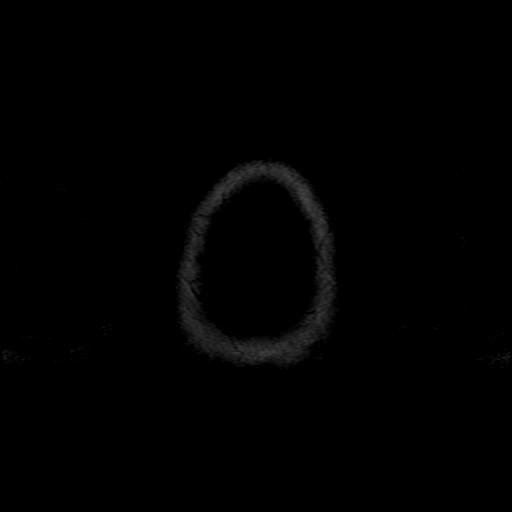

[Series 7: T1 · sagittal · 5.0mm · 0.47mm/px · 2 of 25 slices shown]
[im 1/25]
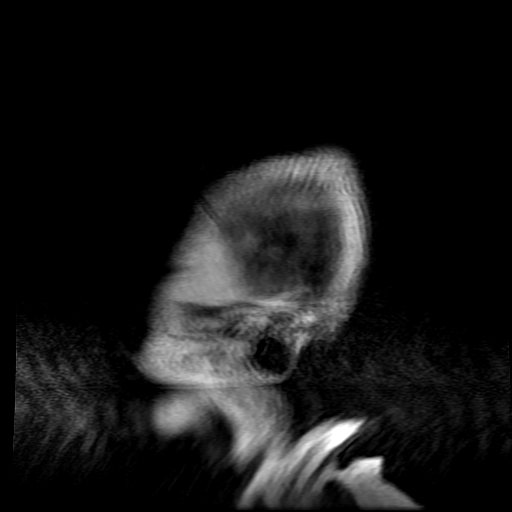
[im 25/25]
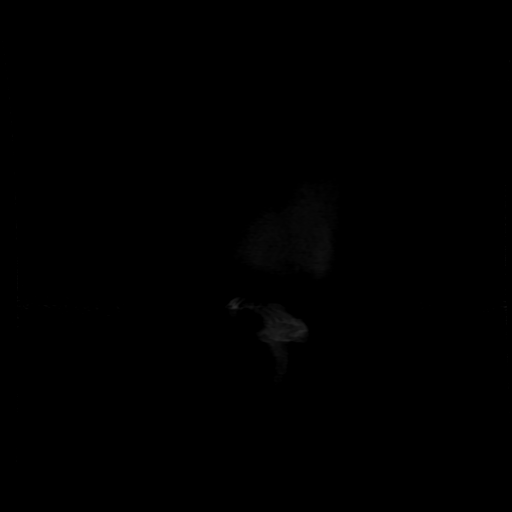

[Series 8: DWI · axial · 3.0mm · 1.09mm/px · z∈[-69,+73]mm · 8 of 100 slices shown (3 of 6)]
[im 1/100]
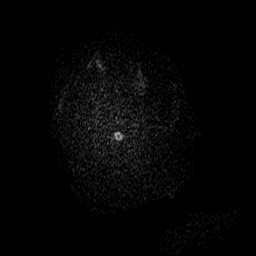
[im 15/100]
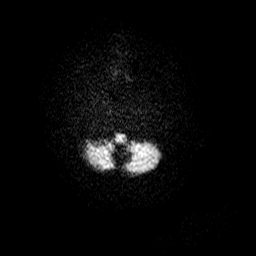
[im 29/100]
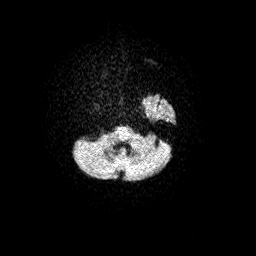
[im 43/100]
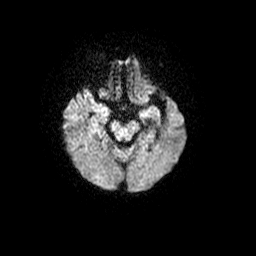
[im 57/100]
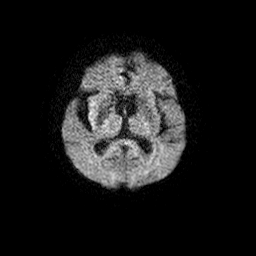
[im 71/100]
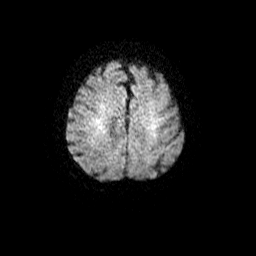
[im 85/100]
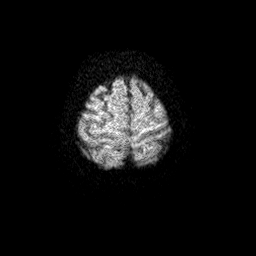
[im 100/100]
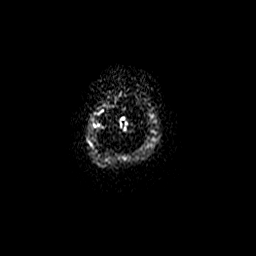

[Series 300: DWI · axial · 3.0mm · 1.09mm/px · z∈[-69,+73]mm · 4 of 50 slices shown (4 of 6)]
[im 1/50]
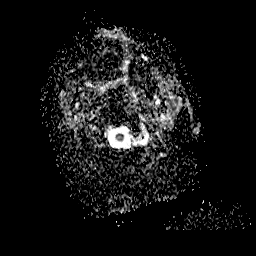
[im 17/50]
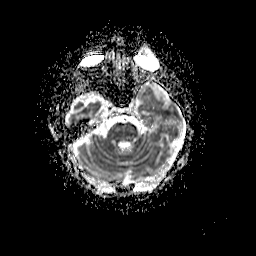
[im 33/50]
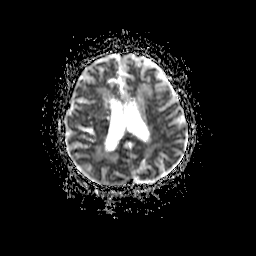
[im 50/50]
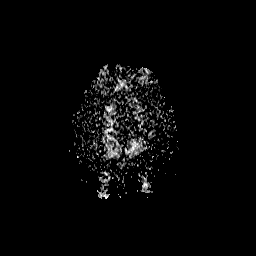

[Series 400: DWI · coronal · 5.0mm · 1.09mm/px · 3 of 34 slices shown (5 of 6)]
[im 1/34]
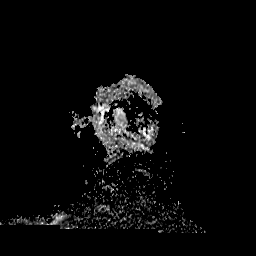
[im 17/34]
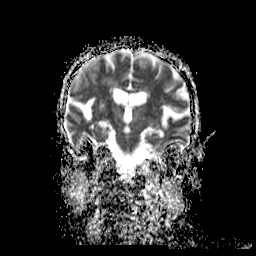
[im 34/34]
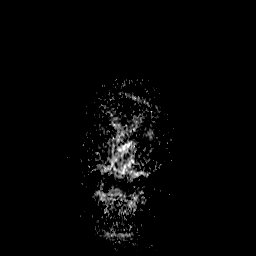

[Series 800: DWI · axial · 3.0mm · 1.09mm/px · z∈[-69,+73]mm · 4 of 50 slices shown (6 of 6)]
[im 1/50]
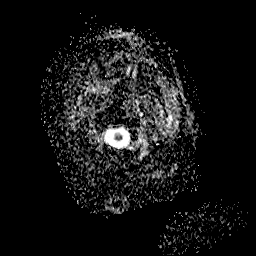
[im 17/50]
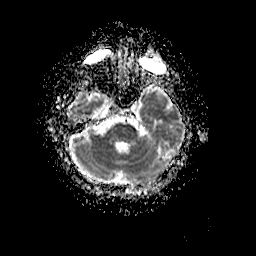
[im 33/50]
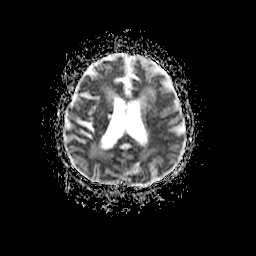
[im 50/50]
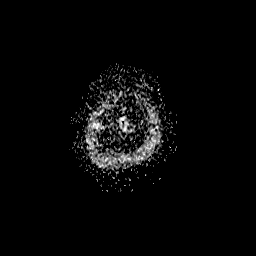

[40 of 48 positions shown; findings below may reference images not displayed]

FINDINGS: The examination had to be discontinued prior to completion due to
patient mental status and inability to cooperate with the
technologist's instructions. Additionally, the examination is
degraded by motion. Nine series are provided for interpretation.

MRI HEAD FINDINGS

BRAIN: There is no acute infarct, acute hemorrhage or extra-axial
collection. Diffuse confluent hyperintense T2-weighted signal within
the periventricular, deep and juxtacortical white matter, most
commonly due to chronic ischemic microangiopathy. There is
generalized atrophy without lobar predilection. The midline
structures are normal.

VASCULAR: The major intracranial arterial and venous sinus flow
voids are normal. Susceptibility-sensitive sequences show no chronic
microhemorrhage or superficial siderosis.

SKULL AND UPPER CERVICAL SPINE: Calvarial bone marrow signal is
normal. There is no skull base mass. The visualized upper cervical
spine and soft tissues are normal.

SINUSES/ORBITS: There are no fluid levels or advanced mucosal
thickening. The mastoid air cells and middle ear cavities are free
of fluid. The orbits are normal.

MRA HEAD FINDINGS

The MRA portion of the examination is severely motion degraded.
There is no antegrade flow demonstrated in the left ICA. The
bilateral anterior and middle cerebral arteries are normal. The
basilar artery and the posterior cerebral arteries are patent.
IMPRESSION: 1. Motion degraded examination with no acute ischemia.
2. Severe chronic small vessel disease and atrophy.
3. Severely motion degraded MRA without antegrade flow in the left
ICA. By history, the patient has a known left ICA occlusion.
4. MRA of the neck was not performed due to difficulties described
above. CTA of the neck may provide better characterization with a
shorter imaging time.

## 2020-08-15 IMAGING — DX DG CHEST 1V PORT
1 series · 1 of 1 positions shown · non-contrast
Comparison: 09/23/2019.  09/20/2019.

CLINICAL DATA: CHF.

EXAM:
PORTABLE CHEST 1 VIEW

[chest]
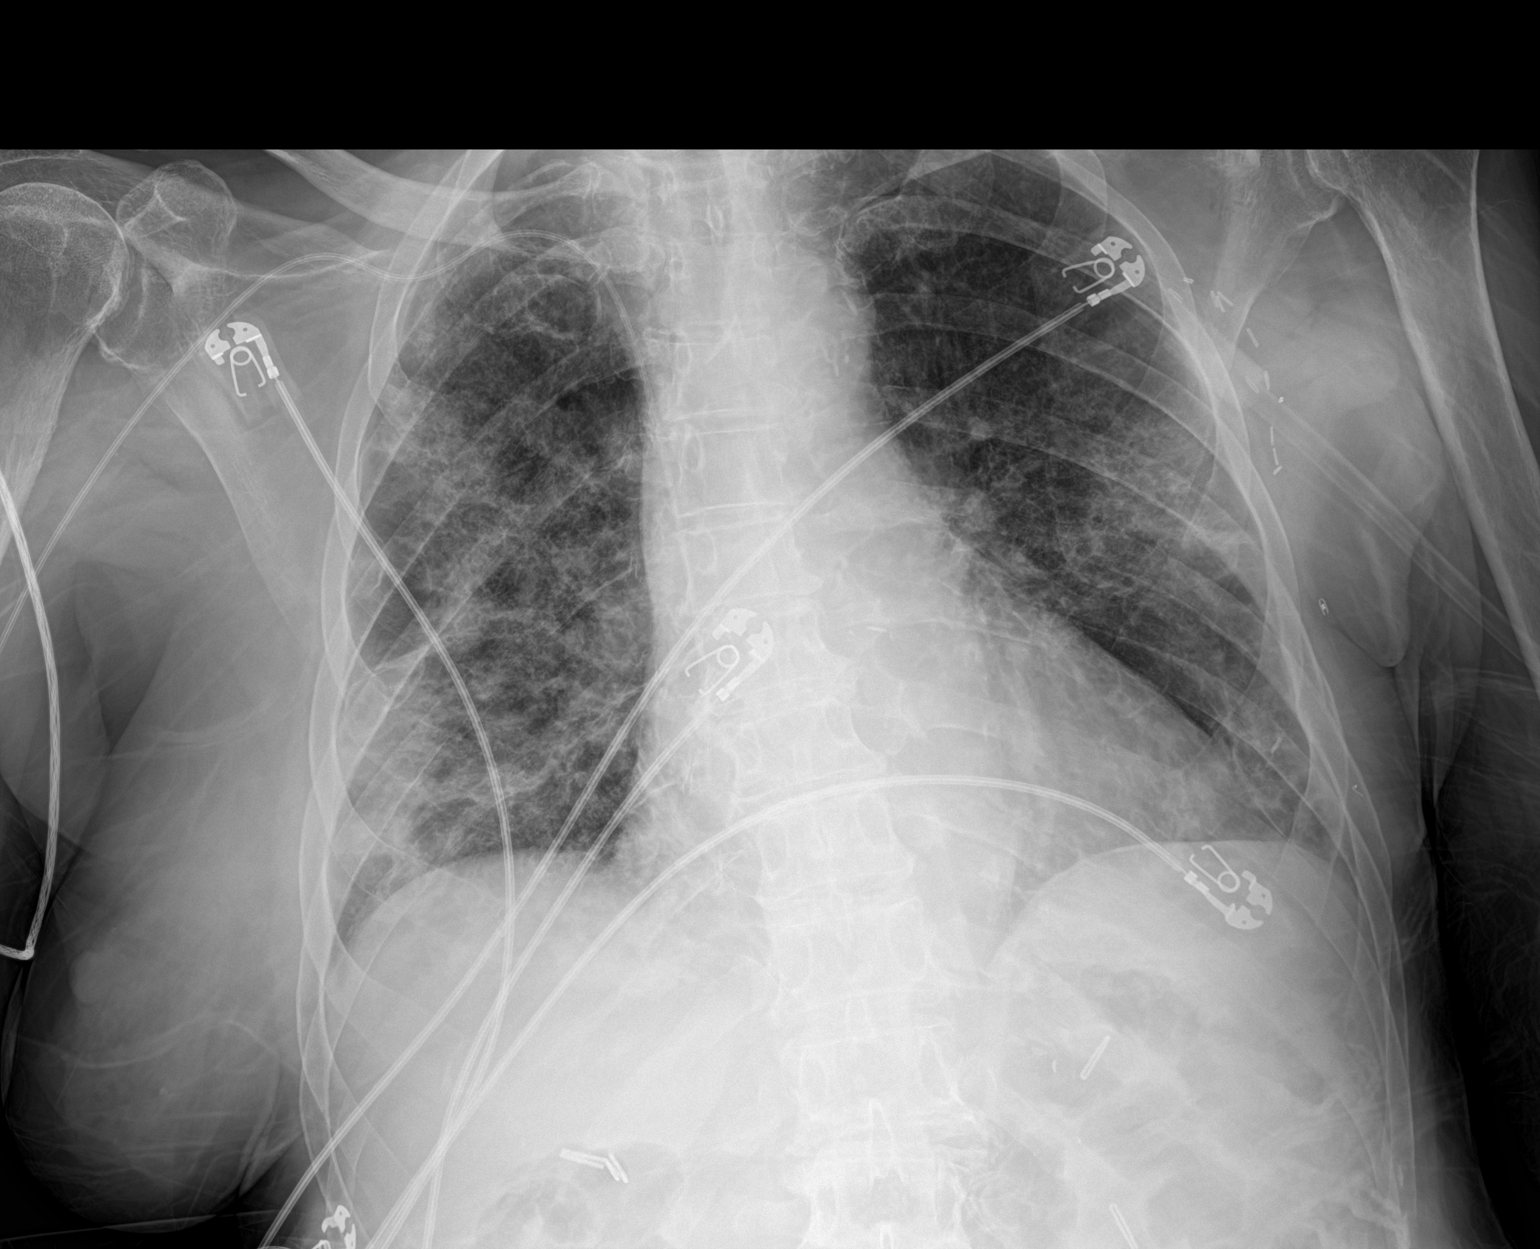

[1 of 1 positions shown; findings below may reference images not displayed]

FINDINGS: Right PICC line noted with tip over superior vena cava. Stable
cardiomegaly. No pulmonary venous congestion. Diffuse bilateral
pulmonary infiltrates. Slight worsening from prior exam. No pleural
effusion or pneumothorax. Prior left mastectomy. Surgical clips over
upper abdomen. Degenerative changes thoracic spine.
IMPRESSION: 1.  Right PICC line noted with tip over superior vena cava.

2. Diffuse bilateral pulmonary interstitial infiltrates. Slight
worsening from prior exam.

## 2020-08-20 IMAGING — DX DG CHEST 1V PORT
1 series · 1 of 1 positions shown · non-contrast
Comparison: 09/28/2019

CLINICAL DATA: Acute respiratory disease, COVID positive

EXAM:
PORTABLE CHEST 1 VIEW

[chest]
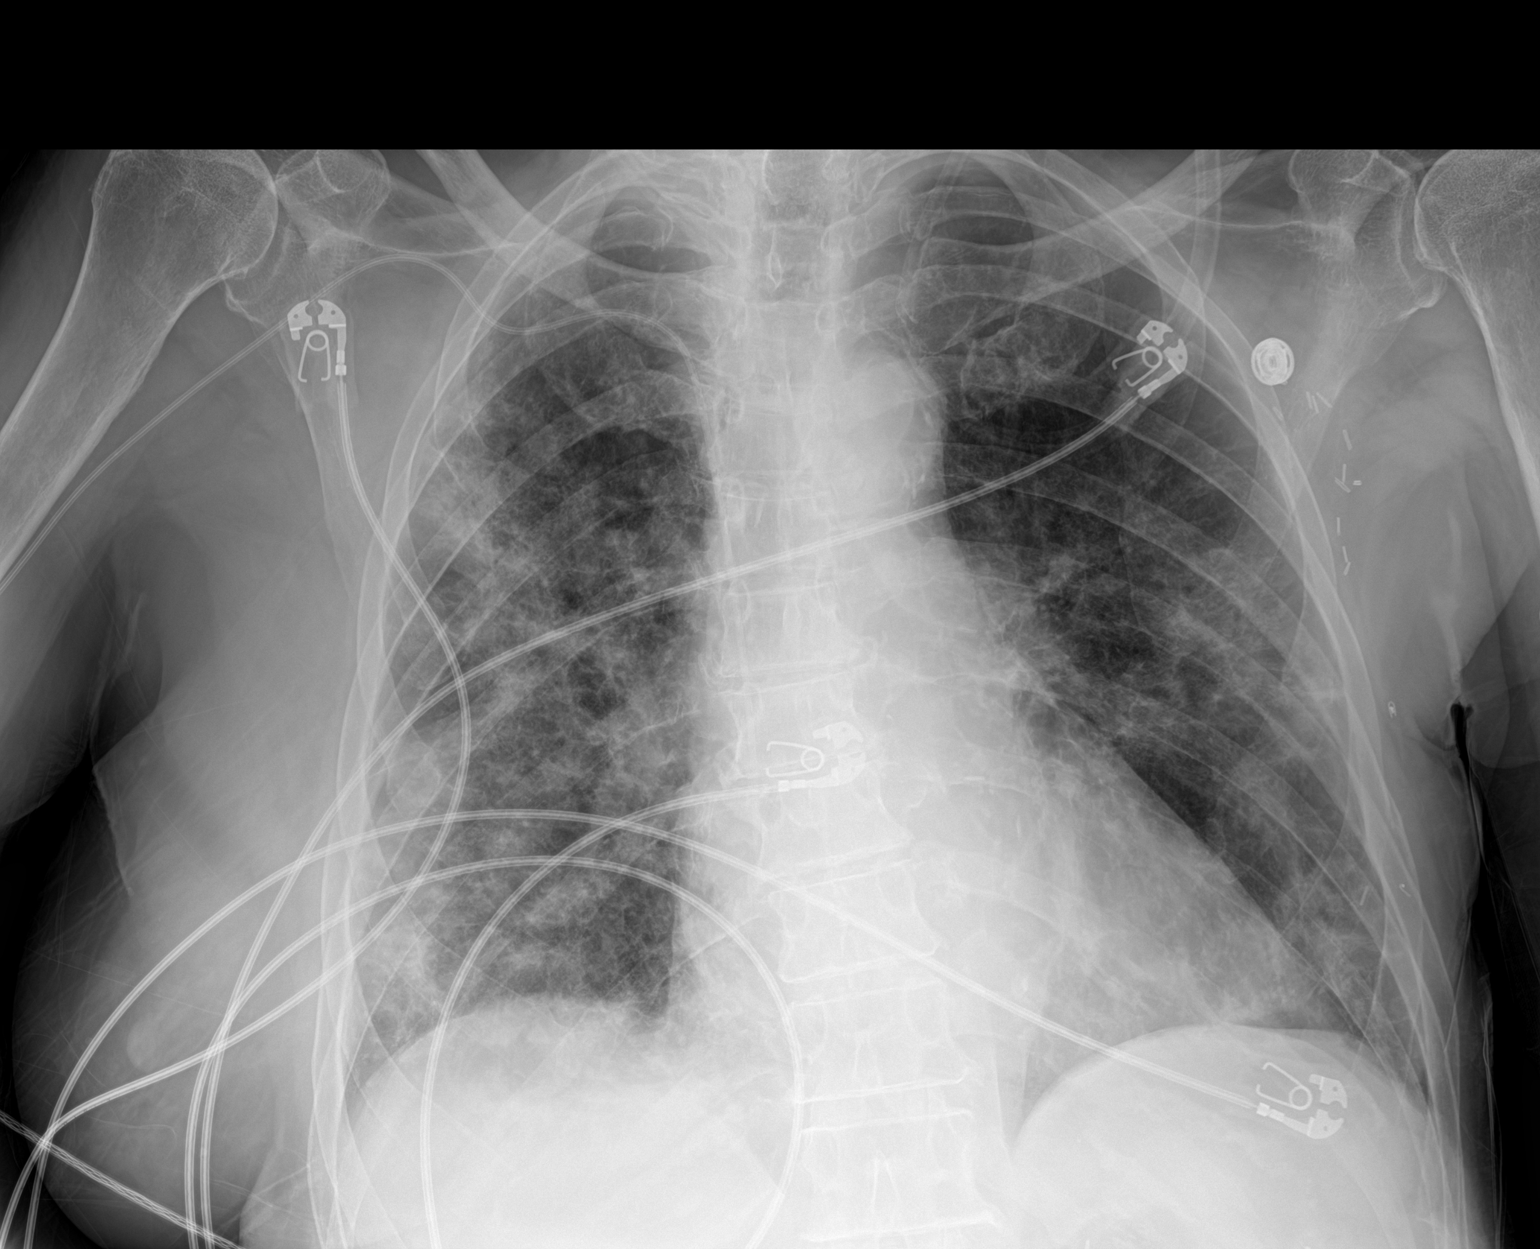

[1 of 1 positions shown; findings below may reference images not displayed]

FINDINGS: Right PICC line tip SVC RA junction level. Stable heart size and
vascularity. Negative for edema or effusion. No pneumothorax.

Given changes in technique, stable patchy mixed airspace and
interstitial opacities throughout both lungs, slightly worse on the
right. Trachea midline. Aorta atherosclerotic. Remote left
mastectomy and axillary lymph node dissection. Degenerative changes
of the spine with a mild scoliosis.
IMPRESSION: Stable patchy bilateral airspace and interstitial opacities, worse
on the right. No significant interval change.
